# Patient Record
Sex: Female | Born: 1940 | Race: Black or African American | Hispanic: No | Smoking: Never smoker
Health system: Southern US, Community
[De-identification: ages and names within clinical notes are randomized; demographics above are authoritative.]

## PROBLEM LIST (undated history)

## (undated) DIAGNOSIS — R42 Dizziness and giddiness: Secondary | ICD-10-CM

## (undated) DIAGNOSIS — I4892 Unspecified atrial flutter: Secondary | ICD-10-CM

## (undated) DIAGNOSIS — F439 Reaction to severe stress, unspecified: Secondary | ICD-10-CM

## (undated) DIAGNOSIS — J42 Unspecified chronic bronchitis: Secondary | ICD-10-CM

## (undated) DIAGNOSIS — M1990 Unspecified osteoarthritis, unspecified site: Secondary | ICD-10-CM

## (undated) DIAGNOSIS — F4320 Adjustment disorder, unspecified: Secondary | ICD-10-CM

## (undated) DIAGNOSIS — I10 Essential (primary) hypertension: Secondary | ICD-10-CM

## (undated) DIAGNOSIS — I499 Cardiac arrhythmia, unspecified: Secondary | ICD-10-CM

## (undated) DIAGNOSIS — M79671 Pain in right foot: Secondary | ICD-10-CM

## (undated) DIAGNOSIS — R002 Palpitations: Secondary | ICD-10-CM

## (undated) DIAGNOSIS — Z9289 Personal history of other medical treatment: Secondary | ICD-10-CM

## (undated) DIAGNOSIS — E559 Vitamin D deficiency, unspecified: Secondary | ICD-10-CM

## (undated) DIAGNOSIS — E785 Hyperlipidemia, unspecified: Secondary | ICD-10-CM

## (undated) DIAGNOSIS — I495 Sick sinus syndrome: Secondary | ICD-10-CM

## (undated) DIAGNOSIS — Z950 Presence of cardiac pacemaker: Secondary | ICD-10-CM

## (undated) HISTORY — DX: Pain in right foot: M79.671

## (undated) HISTORY — DX: Sick sinus syndrome: I49.5

## (undated) HISTORY — DX: Essential (primary) hypertension: I10

## (undated) HISTORY — DX: Cardiac arrhythmia, unspecified: I49.9

## (undated) HISTORY — PX: ABDOMINAL HYSTERECTOMY: SHX81

## (undated) HISTORY — PX: TONSILLECTOMY AND ADENOIDECTOMY: SUR1326

## (undated) HISTORY — PX: CHOLECYSTECTOMY OPEN: SUR202

## (undated) HISTORY — DX: Adjustment disorder, unspecified: F43.20

## (undated) HISTORY — DX: Reaction to severe stress, unspecified: F43.9

## (undated) HISTORY — DX: Palpitations: R00.2

## (undated) HISTORY — DX: Dizziness and giddiness: R42

## (undated) HISTORY — DX: Hyperlipidemia, unspecified: E78.5

## (undated) HISTORY — DX: Unspecified atrial flutter: I48.92

## (undated) HISTORY — DX: Vitamin D deficiency, unspecified: E55.9

---

## 2013-09-10 DIAGNOSIS — I10 Essential (primary) hypertension: Secondary | ICD-10-CM | POA: Insufficient documentation

## 2013-09-10 DIAGNOSIS — E7800 Pure hypercholesterolemia, unspecified: Secondary | ICD-10-CM | POA: Insufficient documentation

## 2015-10-25 DIAGNOSIS — R739 Hyperglycemia, unspecified: Secondary | ICD-10-CM | POA: Insufficient documentation

## 2016-10-30 DIAGNOSIS — I480 Paroxysmal atrial fibrillation: Secondary | ICD-10-CM | POA: Insufficient documentation

## 2016-11-30 ENCOUNTER — Encounter: Payer: Self-pay | Admitting: Internal Medicine

## 2016-12-03 DIAGNOSIS — E785 Hyperlipidemia, unspecified: Secondary | ICD-10-CM | POA: Insufficient documentation

## 2016-12-03 DIAGNOSIS — I499 Cardiac arrhythmia, unspecified: Secondary | ICD-10-CM | POA: Insufficient documentation

## 2016-12-03 DIAGNOSIS — I4892 Unspecified atrial flutter: Secondary | ICD-10-CM | POA: Insufficient documentation

## 2016-12-03 DIAGNOSIS — I495 Sick sinus syndrome: Secondary | ICD-10-CM | POA: Insufficient documentation

## 2016-12-04 ENCOUNTER — Encounter (INDEPENDENT_AMBULATORY_CARE_PROVIDER_SITE_OTHER): Payer: Self-pay

## 2016-12-04 ENCOUNTER — Encounter: Payer: Self-pay | Admitting: *Deleted

## 2016-12-04 ENCOUNTER — Ambulatory Visit (INDEPENDENT_AMBULATORY_CARE_PROVIDER_SITE_OTHER): Payer: Medicare Other | Admitting: Internal Medicine

## 2016-12-04 ENCOUNTER — Encounter: Payer: Self-pay | Admitting: Internal Medicine

## 2016-12-04 VITALS — BP 140/72 | HR 87 | Ht 62.0 in | Wt 144.4 lb

## 2016-12-04 DIAGNOSIS — I480 Paroxysmal atrial fibrillation: Secondary | ICD-10-CM

## 2016-12-04 NOTE — Patient Instructions (Addendum)
Medication Instructions:  Your physician recommends that you continue on your current medications as directed. Please refer to the Current Medication list given to you today.   Labwork: Lab work to be done today--BMP, CBC   Testing/Procedures: Your physician has recommended that you have a pacemaker inserted. A pacemaker is a small device that is placed under the skin of your chest or abdomen to help control abnormal heart rhythms. This device uses electrical pulses to prompt the heart to beat at a normal rate. Pacemakers are used to treat heart rhythms that are too slow. Wire (leads) are attached to the pacemaker that goes into the chambers of you heart. This is done in the hospital and usually requires and overnight stay. Please see the instruction sheet given to you today for more information.  Please cal [place] to schedule this. Available dates are May 9,10,11.  Follow-Up: Your physician recommends that you schedule a follow-up appointment--2 weeks after pacemaker insertion in the device clinic and 91 days after pacemaker insertion with Dr. Ladona Ridgel.  This can be arranged when pacemaker insertion date determined.     Any Other Special Instructions Will Be Listed Below (If Applicable).     If you need a refill on your cardiac medications before your next appointment, please call your pharmacy.   Pacemaker Implantation, Adult Pacemaker implantation is a procedure to place a pacemaker inside your chest. A pacemaker is a small computer that sends electrical signals to the heart and helps your heart beat normally. A pacemaker also stores information about your heart rhythms. You may need pacemaker implantation if you:  Have a slow heartbeat (bradycardia).  Faint (syncope).  Have shortness of breath (dyspnea) due to heart problems. The pacemaker attaches to your heart through a wire, called a lead. Sometimes just one lead is needed. Other times, there will be two leads. There are two types  of pacemakers:  Transvenous pacemaker. This type is placed under the skin or muscle of your chest. The lead goes through a vein in the chest area to reach the inside of the heart.  Epicardial pacemaker. This type is placed under the skin or muscle of your chest or belly. The lead goes through your chest to the outside of the heart. Tell a health care provider about:  Any allergies you have.  All medicines you are taking, including vitamins, herbs, eye drops, creams, and over-the-counter medicines.  Any problems you or family members have had with anesthetic medicines.  Any blood or bone disorders you have.  Any surgeries you have had.  Any medical conditions you have.  Whether you are pregnant or may be pregnant. What are the risks? Generally, this is a safe procedure. However, problems may occur, including:  Infection.  Bleeding.  Failure of the pacemaker or the lead.  Collapse of a lung or bleeding into a lung.  Blood clot inside a blood vessel with a lead.  Damage to the heart.  Infection inside the heart (endocarditis).  Allergic reactions to medicines. What happens before the procedure? Staying hydrated  Follow instructions from your health care provider about hydration, which may include:  Up to 2 hours before the procedure - you may continue to drink clear liquids, such as water, clear fruit juice, black coffee, and plain tea. Eating and drinking restrictions  Follow instructions from your health care provider about eating and drinking, which may include:  8 hours before the procedure - stop eating heavy meals or foods such as meat, fried foods,  or fatty foods.  6 hours before the procedure - stop eating light meals or foods, such as toast or cereal.  6 hours before the procedure - stop drinking milk or drinks that contain milk.  2 hours before the procedure - stop drinking clear liquids. Medicines   Ask your health care provider about:  Changing or  stopping your regular medicines. This is especially important if you are taking diabetes medicines or blood thinners.  Taking medicines such as aspirin and ibuprofen. These medicines can thin your blood. Do not take these medicines before your procedure if your health care provider instructs you not to.  You may be given antibiotic medicine to help prevent infection. General instructions   You will have a heart evaluation. This may include an electrocardiogram (ECG), chest X-ray, and heart imaging (echocardiogram,  or echo) tests.  You will have blood tests.  Do not use any products that contain nicotine or tobacco, such as cigarettes and e-cigarettes. If you need help quitting, ask your health care provider.  Plan to have someone take you home from the hospital or clinic.  If you will be going home right after the procedure, plan to have someone with you for 24 hours.  Ask your health care provider how your surgical site will be marked or identified. What happens during the procedure?  To reduce your risk of infection:  Your health care team will wash or sanitize their hands.  Your skin will be washed with soap.  Hair may be removed from the surgical area.  An IV tube will be inserted into one of your veins.  You will be given one or more of the following:  A medicine to help you relax (sedative).  A medicine to numb the area (local anesthetic).  A medicine to make you fall asleep (general anesthetic).  If you are getting a transvenous pacemaker:  An incision will be made in your upper chest.  A pocket will be made for the pacemaker. It may be placed under the skin or between layers of muscle.  The lead will be inserted into a blood vessel that returns to the heart.  While X-rays are taken by an imaging machine (fluoroscopy), the lead will be advanced through the vein to the inside of your heart.  The other end of the lead will be tunneled under the skin and attached  to the pacemaker.  If you are getting an epicardial pacemaker:  An incision will be made near your ribs or breastbone (sternum) for the lead.  The lead will be attached to the outside of your heart.  Another incision will be made in your chest or upper belly to create a pocket for the pacemaker.  The free end of the lead will be tunneled under the skin and attached to the pacemaker.  The transvenous or epicardial pacemaker will be tested. Imaging studies may be done to check the lead position.  The incisions will be closed with stitches (sutures), adhesive strips, or skin glue.  Bandages (dressing) will be placed over the incisions. The procedure may vary among health care providers and hospitals. What happens after the procedure?  Your blood pressure, heart rate, breathing rate, and blood oxygen level will be monitored until the medicines you were given have worn off.  You will be given antibiotics and pain medicine.  ECG and chest x-rays will be done.  You will wear a continuous type of ECG (Holter monitor) to check your heart rhythm.  Your  health care provider willprogram the pacemaker.  Do not drive for 24 hours if you received a sedative. This information is not intended to replace advice given to you by your health care provider. Make sure you discuss any questions you have with your health care provider. Document Released: 07/13/2002 Document Revised: 02/10/2016 Document Reviewed: 01/04/2016 Elsevier Interactive Patient Education  2017 [employer].

## 2016-12-04 NOTE — Progress Notes (Signed)
HPI Kristina Gonzales is referred today by Dr. 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 She has been healthy until a month or two ago when she was noted to have spells where she would feel lightheaded. She was found on cardiac monitoring to be in atrial fib with a RVR and HR's over 120/min. She would then have post termination pauses due to sinus node dysfunction of upto 4 seconds. She has never passed out completely. She feels fatigue and some dyspnea when her atrial fib is going fast. She was placed on Xarelto by her MD in [facility]. Her sons live in the area Engineer, agricultural in [place] and Marine scientist in [place]) and would like to remain for any medical procedures. She is fairly anxious. She denies anginal symptoms and has minimal CHF symptoms.   No Known Allergies   Current Outpatient Prescriptions  Medication Sig Dispense Refill  . Cod Liver Oil CAPS Take 1 capsule by mouth daily.    Marland Kitchen diltiazem (DILACOR XR) 120 MG 24 hr capsule Take 120 mg by mouth daily.    Marland Kitchen ezetimibe (ZETIA) 10 MG tablet Take 10 mg by mouth daily.    Marland Kitchen losartan (COZAAR) 100 MG tablet Take 100 mg by mouth daily.    . metoprolol succinate (TOPROL-XL) 50 MG 24 hr tablet Take 25 mg by mouth daily. Take with or immediately following a meal.    . Sodium Fluoride (PREVIDENT 5000 BOOSTER PLUS) 1.1 % PSTE Place onto teeth.    . spironolactone-hydrochlorothiazide (ALDACTAZIDE) 25-25 MG tablet Take 1 tablet by mouth daily.    [name] 20 MG TABS tablet Take 20 mg by mouth daily.  10   No current facility-administered medications for this visit.      Past Medical History:  Diagnosis Date  . Acute pain of right foot   . Anticipatory grieving    06/22/15  . Atrial flutter (HCC)   . Dizziness   . H/O: hysterectomy   . Hx of cholecystectomy   . Hyperlipidemia   . Hypertension   . Irregular heartbeat   . Palpitations   . Stress at home   . Tachy-brady  syndrome (HCC)   . Vitamin D deficiency     ROS:   All systems reviewed and negative except as noted in the HPI.   Past Surgical History:  Procedure Laterality Date  . CHOLECYSTETOMY    . TONSILLECTOMY AND ADENOIDECTOMY       Family History  Problem Relation Age of Onset  . Hypertension Mother     UNDER HOSPICE CARE  . Diverticulosis Mother   . Other Father 80    MYOCARDIAL INFARCTION  . Other Other     PT HAS SIBLINGS X 7     Social History   Social History  . Marital status: Widowed    Spouse name: N/A  . Number of children: 2  . Years of education: N/A   Occupational History  . RETIRED    Social History Main Topics  . Smoking status: Never Smoker  . Smokeless tobacco: Never Used  . Alcohol use No  . Drug use: No  . Sexual activity: Not on file   Other Topics Concern  . Not on file   Social History Narrative  . No narrative on file     BP 140/72 (BP Location: Left Arm)   Pulse 87   Physical Exam:  Well appearing 76  yo woman, NAD HEENT: Unremarkable Neck:  6 cm JVD, no thyromegally Lymphatics:  No adenopathy Back:  No CVA tenderness Lungs:  Clear with no wheezes HEART:  Regular rate rhythm, no murmurs, no rubs, no clicks Abd:  soft, positive bowel sounds, no organomegally, no rebound, no guarding Ext:  2 plus pulses, no edema, no cyanosis, no clubbing Skin:  No rashes no nodules Neuro:  CN II through XII intact, motor grossly intact  EKG - reviewed. NSR with a short PR  Holter monitor - NSR with PAF with a RVR and post termination pauses of up to 4 seconds during the daytime.   Assess/Plan: 1. Atrial fib with a RVR - I have discussed the treatment options with the patient and her son. I would recommend that her diltiazem be stopped and she switched to flecainide in conjunction with her beta blocker once she has some HR support.  2. Coags - she is taking Xarelto and appears to be tolerating this medication nicely 3. Sinus node dysfunction  - she is having daytime pauses of 4 seconds. While she is on both a beta blocker and a calcium channel antagonist, She requires these meds to control her atrial fib. I have discussed the indications, risk,benefits,goals,and expectations of PPM insertion and she would like to consider her options and will call us if she wishes to proceed. 4. HTN heart disease - her blood pressure is up a bit. We would consider switching her to coreg after her PPM is in place.  Lewayne Bunting, M.D.

## 2016-12-05 ENCOUNTER — Telehealth: Payer: Self-pay | Admitting: Internal Medicine

## 2016-12-05 LAB — CBC WITH DIFFERENTIAL/PLATELET
Basophils Absolute: 0 10*3/uL (ref 0.0–0.2)
Basos: 0 %
EOS (ABSOLUTE): 0.2 10*3/uL (ref 0.0–0.4)
EOS: 4 %
HEMATOCRIT: 43.2 % (ref 34.0–46.6)
HEMOGLOBIN: 14.6 g/dL (ref 11.1–15.9)
IMMATURE GRANS (ABS): 0 10*3/uL (ref 0.0–0.1)
IMMATURE GRANULOCYTES: 0 %
LYMPHS: 39 %
Lymphocytes Absolute: 2 10*3/uL (ref 0.7–3.1)
MCH: 29.1 pg (ref 26.6–33.0)
MCHC: 33.8 g/dL (ref 31.5–35.7)
MCV: 86 fL (ref 79–97)
MONOCYTES: 8 %
Monocytes Absolute: 0.4 10*3/uL (ref 0.1–0.9)
NEUTROS PCT: 49 %
Neutrophils Absolute: 2.5 10*3/uL (ref 1.4–7.0)
Platelets: 265 10*3/uL (ref 150–379)
RBC: 5.02 x10E6/uL (ref 3.77–5.28)
RDW: 13.7 % (ref 12.3–15.4)
WBC: 5.2 10*3/uL (ref 3.4–10.8)

## 2016-12-05 LAB — BASIC METABOLIC PANEL
BUN / CREAT RATIO: 24 (ref 12–28)
BUN: 22 mg/dL (ref 8–27)
CALCIUM: 10.4 mg/dL — AB (ref 8.7–10.3)
CO2: 24 mmol/L (ref 18–29)
CREATININE: 0.93 mg/dL (ref 0.57–1.00)
Chloride: 101 mmol/L (ref 96–106)
GFR calc Af Amer: 70 mL/min/{1.73_m2} (ref >59)
GFR calc non Af Amer: 61 mL/min/{1.73_m2} (ref >59)
GLUCOSE: 108 mg/dL — AB (ref 65–99)
Potassium: 4.4 mmol/L (ref 3.5–5.2)
Sodium: 140 mmol/L (ref 134–144)

## 2016-12-05 NOTE — Telephone Encounter (Signed)
I scheduled procedure for May 9,2018 at 12:30. I spoke with pt and told her to arrive at [facility] at 10:30 on May 9th. Appt in device clinic arranged for 11:30 on May 23,2018. 91 day follow up with Dr. Ladona Ridgel arranged for August 21,2018 at 1:45.  I made pt aware of follow up appointments. Pt already has pacemaker insertion instructions and verbalizes understanding of instructions and arrival time.

## 2016-12-05 NOTE — Telephone Encounter (Signed)
New Message  Pt call requesting to speakw ith RN to det up appt for device. Pt state she would like to set appt for 5/9 around 9am if possible. Please call back to discuss

## 2016-12-12 ENCOUNTER — Ambulatory Visit (HOSPITAL_COMMUNITY)
Admission: RE | Admit: 2016-12-12 | Discharge: 2016-12-13 | Disposition: A | Discharge status: Home / Self Care | Payer: Medicare Other | Source: Ambulatory Visit | Attending: Internal Medicine | Admitting: Internal Medicine

## 2016-12-12 ENCOUNTER — Encounter (HOSPITAL_COMMUNITY)
Admission: RE | Disposition: A | Discharge status: Home / Self Care | Payer: Self-pay | Source: Ambulatory Visit | Attending: Internal Medicine

## 2016-12-12 ENCOUNTER — Encounter (HOSPITAL_COMMUNITY): Payer: Self-pay | Admitting: *Deleted

## 2016-12-12 DIAGNOSIS — I4892 Unspecified atrial flutter: Secondary | ICD-10-CM | POA: Insufficient documentation

## 2016-12-12 DIAGNOSIS — I700 Atherosclerosis of aorta: Secondary | ICD-10-CM | POA: Insufficient documentation

## 2016-12-12 DIAGNOSIS — Z7901 Long term (current) use of anticoagulants: Secondary | ICD-10-CM | POA: Diagnosis not present

## 2016-12-12 DIAGNOSIS — I10 Essential (primary) hypertension: Secondary | ICD-10-CM | POA: Diagnosis not present

## 2016-12-12 DIAGNOSIS — E559 Vitamin D deficiency, unspecified: Secondary | ICD-10-CM | POA: Diagnosis not present

## 2016-12-12 DIAGNOSIS — I480 Paroxysmal atrial fibrillation: Secondary | ICD-10-CM | POA: Diagnosis not present

## 2016-12-12 DIAGNOSIS — E785 Hyperlipidemia, unspecified: Secondary | ICD-10-CM | POA: Insufficient documentation

## 2016-12-12 DIAGNOSIS — I495 Sick sinus syndrome: Secondary | ICD-10-CM | POA: Diagnosis present

## 2016-12-12 DIAGNOSIS — Z950 Presence of cardiac pacemaker: Secondary | ICD-10-CM

## 2016-12-12 HISTORY — DX: Unspecified chronic bronchitis: J42

## 2016-12-12 HISTORY — DX: Personal history of other medical treatment: Z92.89

## 2016-12-12 HISTORY — PX: PACEMAKER IMPLANT: EP1218

## 2016-12-12 HISTORY — DX: Presence of cardiac pacemaker: Z95.0

## 2016-12-12 HISTORY — PX: INSERT / REPLACE / REMOVE PACEMAKER: SUR710

## 2016-12-12 HISTORY — DX: Unspecified osteoarthritis, unspecified site: M19.90

## 2016-12-12 LAB — SURGICAL PCR SCREEN
MRSA, PCR: NEGATIVE
STAPHYLOCOCCUS AUREUS: POSITIVE — AB

## 2016-12-12 SURGERY — PACEMAKER IMPLANT

## 2016-12-12 MED ORDER — CHLORHEXIDINE GLUCONATE 4 % EX LIQD: 60.0000 mL | Freq: Once | CUTANEOUS | Status: DC

## 2016-12-12 MED ORDER — ADULT MULTIVITAMIN W/MINERALS CH
1.0000 | ORAL_TABLET | Freq: Every day | ORAL | Status: DC
  Administered 2016-12-12 – 2016-12-13 (×2): 1 via ORAL
  Filled 2016-12-12 (×2): qty 1

## 2016-12-12 MED ORDER — EZETIMIBE 10 MG PO TABS
10.0000 mg | ORAL_TABLET | Freq: Every day | ORAL | Status: DC
  Administered 2016-12-12 – 2016-12-13 (×2): 10 mg via ORAL
  Filled 2016-12-12 (×2): qty 1

## 2016-12-12 MED ORDER — YOU HAVE A PACEMAKER BOOK
Freq: Once | Status: AC
  Administered 2016-12-12: 21:00:00
  Filled 2016-12-12: qty 1

## 2016-12-12 MED ORDER — COD LIVER OIL/VITAMINS A & D PO CAPS: 1.0000 | ORAL_CAPSULE | Freq: Every day | ORAL | Status: DC

## 2016-12-12 MED ORDER — MUPIROCIN 2 % EX OINT
1.0000 "application " | TOPICAL_OINTMENT | Freq: Two times a day (BID) | CUTANEOUS | Status: DC
  Administered 2016-12-12 – 2016-12-13 (×2): 1 via NASAL

## 2016-12-12 MED ORDER — MUPIROCIN 2 % EX OINT
1.0000 "application " | TOPICAL_OINTMENT | Freq: Once | CUTANEOUS | Status: AC
  Administered 2016-12-12: 1 via TOPICAL

## 2016-12-12 MED ORDER — ONDANSETRON HCL 4 MG/2ML IJ SOLN: 4.0000 mg | Freq: Four times a day (QID) | INTRAMUSCULAR | Status: DC | PRN

## 2016-12-12 MED ORDER — MUPIROCIN 2 % EX OINT
TOPICAL_OINTMENT | CUTANEOUS | Status: AC
  Administered 2016-12-12: 1 via TOPICAL
  Filled 2016-12-12: qty 22

## 2016-12-12 MED ORDER — LOSARTAN POTASSIUM 50 MG PO TABS
100.0000 mg | ORAL_TABLET | Freq: Every day | ORAL | Status: DC
  Administered 2016-12-12: 100 mg via ORAL
  Filled 2016-12-12: qty 2

## 2016-12-12 MED ORDER — FENTANYL CITRATE (PF) 100 MCG/2ML IJ SOLN
INTRAMUSCULAR | Status: AC
Start: 2016-12-12 — End: 2016-12-12
  Filled 2016-12-12: qty 2

## 2016-12-12 MED ORDER — SODIUM CHLORIDE 0.9 % IV SOLN
INTRAVENOUS | Status: DC
  Administered 2016-12-12: 12:00:00 via INTRAVENOUS

## 2016-12-12 MED ORDER — SPIRONOLACTONE-HCTZ 25-25 MG PO TABS
0.5000 | ORAL_TABLET | Freq: Every day | ORAL | Status: DC
  Filled 2016-12-12: qty 1

## 2016-12-12 MED ORDER — MIDAZOLAM HCL 5 MG/5ML IJ SOLN
INTRAMUSCULAR | Status: DC | PRN
  Administered 2016-12-12 (×4): 1 mg via INTRAVENOUS

## 2016-12-12 MED ORDER — CEFAZOLIN SODIUM-DEXTROSE 2-4 GM/100ML-% IV SOLN
INTRAVENOUS | Status: AC
  Filled 2016-12-12: qty 100

## 2016-12-12 MED ORDER — LIDOCAINE HCL (PF) 1 % IJ SOLN
INTRAMUSCULAR | Status: AC
  Filled 2016-12-12: qty 60

## 2016-12-12 MED ORDER — HYDROCHLOROTHIAZIDE 25 MG PO TABS
25.0000 mg | ORAL_TABLET | Freq: Every day | ORAL | Status: DC
  Administered 2016-12-12: 25 mg via ORAL
  Filled 2016-12-12: qty 1

## 2016-12-12 MED ORDER — METOPROLOL SUCCINATE ER 50 MG PO TB24
50.0000 mg | ORAL_TABLET | Freq: Every day | ORAL | Status: DC
  Administered 2016-12-12: 50 mg via ORAL
  Filled 2016-12-12: qty 1

## 2016-12-12 MED ORDER — CEFAZOLIN SODIUM-DEXTROSE 1-4 GM/50ML-% IV SOLN
1.0000 g | Freq: Four times a day (QID) | INTRAVENOUS | Status: AC
  Administered 2016-12-12 – 2016-12-13 (×3): 1 g via INTRAVENOUS
  Filled 2016-12-12 (×3): qty 50

## 2016-12-12 MED ORDER — SODIUM CHLORIDE 0.9 % IR SOLN
80.0000 mg | Status: AC
  Administered 2016-12-12: 80 mg

## 2016-12-12 MED ORDER — SPIRONOLACTONE 25 MG PO TABS
25.0000 mg | ORAL_TABLET | Freq: Every day | ORAL | Status: DC
  Administered 2016-12-12: 25 mg via ORAL
  Filled 2016-12-12: qty 1

## 2016-12-12 MED ORDER — OFF THE BEAT BOOK
Freq: Once | Status: AC
  Administered 2016-12-12: 21:00:00
  Filled 2016-12-12: qty 1

## 2016-12-12 MED ORDER — ACETAMINOPHEN 325 MG PO TABS
325.0000 mg | ORAL_TABLET | ORAL | Status: DC | PRN
  Administered 2016-12-12 – 2016-12-13 (×2): 650 mg via ORAL
  Filled 2016-12-12 (×2): qty 2

## 2016-12-12 MED ORDER — FENTANYL CITRATE (PF) 100 MCG/2ML IJ SOLN
INTRAMUSCULAR | Status: DC | PRN
  Administered 2016-12-12: 25 ug via INTRAVENOUS
  Administered 2016-12-12 (×2): 12.5 ug via INTRAVENOUS

## 2016-12-12 MED ORDER — CHLORHEXIDINE GLUCONATE CLOTH 2 % EX PADS
6.0000 | MEDICATED_PAD | Freq: Every day | CUTANEOUS | Status: DC
  Administered 2016-12-12 – 2016-12-13 (×2): 6 via TOPICAL

## 2016-12-12 MED ORDER — SODIUM FLUORIDE 1.1 % DT PSTE: 1.0000 "application " | PASTE | Freq: Every day | DENTAL | Status: DC

## 2016-12-12 MED ORDER — SODIUM CHLORIDE 0.9 % IR SOLN
Status: AC
  Filled 2016-12-12: qty 2

## 2016-12-12 MED ORDER — SODIUM CHLORIDE 0.9 % IV SOLN: INTRAVENOUS | Status: DC

## 2016-12-12 MED ORDER — HEPARIN (PORCINE) IN NACL 2-0.9 UNIT/ML-% IJ SOLN
INTRAMUSCULAR | Status: DC | PRN
  Administered 2016-12-12: 14:00:00

## 2016-12-12 MED ORDER — MIDAZOLAM HCL 5 MG/5ML IJ SOLN
INTRAMUSCULAR | Status: AC
  Filled 2016-12-12: qty 5

## 2016-12-12 MED ORDER — LIDOCAINE HCL (PF) 1 % IJ SOLN
INTRAMUSCULAR | Status: DC | PRN
  Administered 2016-12-12: 40 mL via INTRADERMAL

## 2016-12-12 MED ORDER — HEPARIN (PORCINE) IN NACL 2-0.9 UNIT/ML-% IJ SOLN
INTRAMUSCULAR | Status: AC
  Filled 2016-12-12: qty 500

## 2016-12-12 MED ORDER — CEFAZOLIN SODIUM-DEXTROSE 2-4 GM/100ML-% IV SOLN
2.0000 g | INTRAVENOUS | Status: AC
  Administered 2016-12-12: 2 g via INTRAVENOUS

## 2016-12-12 SURGICAL SUPPLY — 11 items
CABLE SURGICAL S-101-97-12 (CABLE) ×3 IMPLANT
CATH RIGHTSITE C315HIS02 (CATHETERS) ×3 IMPLANT
IPG PACE AZUR XT DR MRI W1DR01 (Pacemaker) ×1 IMPLANT
LEAD CAPSURE NOVUS 45CM (Lead) ×3 IMPLANT
LEAD SELECT SECURE 3830 383069 (Lead) ×1 IMPLANT
PACE AZURE XT DR MRI W1DR01 (Pacemaker) ×3 IMPLANT
PAD DEFIB LIFELINK (PAD) ×3 IMPLANT
SELECT SECURE 3830 383069 (Lead) ×3 IMPLANT
SHEATH CLASSIC 7F (SHEATH) ×6 IMPLANT
TRAY PACEMAKER INSERTION (PACKS) ×3 IMPLANT
WIRE HI TORQ VERSACORE-J 145CM (WIRE) ×3 IMPLANT

## 2016-12-12 NOTE — Discharge Summary (Signed)
ELECTROPHYSIOLOGY PROCEDURE DISCHARGE SUMMARY    Patient ID: Kristina Gonzales,  MRN: [id number], DOB/AGE: August 14, 1940 76 y.o.  Admit date: 12/12/2016 Discharge date: 12/13/16  Primary Care Physician: Cathie Beams, MD  Primary Cardiologist: Dr. Katrinka Blazing Electrophysiologist: Dr. Ladona Ridgel  Primary Discharge Diagnosis:  1. Tachy-brady syndrome  Secondary Discharge Diagnosis:  1. PAF     CHA2DS2Vasc is at least 3, on xarelto 2. HTN 3. HLD  No Known Allergies   Procedures This Admission:  1.  Implantation of a MDT dual chamber PPM on 12/12/16 by Dr Ladona Ridgel.  The patient received a Medtronic (serial number [id number]) pacemaker, with Medtronic [id number] (serial number [id number]) right atrial lead and a Medtronic [id number] (serial number [id number]) right ventricular lead (HIS position) There were no immediate post procedure complications. 2.  CXR on 12/13/16 demonstrated no pneumothorax status post device implantation.   Brief HPI: Kristina Gonzales is a 76 y.o. female was referred to electrophysiology in the outpatient setting for consideration of PPM implantation.  Past medical history includes as noted above.  The patient has had found on monitoring with AF w/RVR and post termination pauses of up to 4 seconds, with symptoms of dizziness, no syncope.  Risks, benefits, and alternatives to PPM implantation were reviewed with the patient who wished to proceed.   Hospital Course:  The patient was admitted and underwent implantation of a PPM with details as outlined above.  She was monitored on telemetry overnight which demonstrated SR.  Left chest was without hematoma or ecchymosis.  The device was interrogated and found to be functioning normally.  CXR was obtained and demonstrated no pneumothorax status post device implantation.  Wound care, arm mobility, and restrictions were reviewed with the patient.  The patient was examined by Dr. Ladona Ridgel and considered stable for discharge to home.  We will stop her  diltiazem and increase her Toprol to 50mg  daily, she is in SR, will resume xarelto tomorrow.   Physical Exam: Vitals:   12/12/16 2000 12/12/16 2057 12/13/16 0640 12/13/16 0800  BP:  (!) 141/64 (!) 175/75 (!) 162/72  Pulse: 80 78 75 78  Resp: (!) 21 14 14 15   Temp:  97.4 F (36.3 C) 97.8 F (36.6 C) 97 F (36.1 C)  TempSrc:  Oral Oral Oral  SpO2: 98% 100% 100% 98%  Weight:   143 lb 4.8 oz (65 kg)   Height:        GEN- The patient is well appearing, alert and oriented x 3 today.   HEENT: normocephalic, atraumatic; sclera clear, conjunctiva pink; hearing intact; oropharynx clear; neck supple, no JVP Lungs- CTA b/l, normal work of breathing.  No wheezes, rales, rhonchi Heart- RRR, no murmurs, rubs or gallops, PMI not laterally displaced GI- soft, non-tender, non-distended Extremities- no clubbing, cyanosis, or edema MS- no significant deformity or atrophy Skin- warm and dry, no rash or lesion, left chest without hematoma/ecchymosis Psych- euthymic mood, full affect Neuro- no gross deficits   Labs:   Lab Results  Component Value Date   WBC 5.2 12/04/2016   HCT 43.2 12/04/2016   MCV 86 12/04/2016   PLT 265 12/04/2016   No results for input(s): NA, K, CL, CO2, BUN, CREATININE, CALCIUM, PROT, BILITOT, ALKPHOS, ALT, AST, GLUCOSE in the last 168 hours.  Invalid input(s): LABALBU  Discharge Medications:  Allergies as of 12/13/2016   No Known Allergies     Medication List    STOP taking these medications   diltiazem 120 MG 24 hr  capsule Commonly known as:  DILACOR XR     TAKE these medications   COD LIVER OIL/VITAMINS A & D Caps Take 1 capsule by mouth daily.   CORICIDIN HBP CONGESTION/COUGH PO Take 1 tablet by mouth 2 (two) times daily as needed (alleriges).   ezetimibe 10 MG tablet Commonly known as:  ZETIA Take 10 mg by mouth daily.   losartan 100 MG tablet Commonly known as:  COZAAR Take 100 mg by mouth daily with supper.   metoprolol succinate 50 MG 24 hr  tablet Commonly known as:  TOPROL-XL Take 1 tablet (50 mg total) by mouth daily with supper. Take with or immediately following a meal. What changed:  how much to take   multivitamin with minerals Tabs tablet Take 1 tablet by mouth daily.   PREVIDENT 5000 BOOSTER PLUS 1.1 % Pste Generic drug:  Sodium Fluoride Place 1 application onto teeth at bedtime.   spironolactone-hydrochlorothiazide 25-25 MG tablet Commonly known as:  ALDACTAZIDE Take 0.5 tablets by mouth daily with supper.   SYSTANE OP Apply 1 drop to eye 2 (two) times daily.   XARELTO 20 MG Tabs tablet Generic drug:  rivaroxaban Take 20 mg by mouth daily with supper.       Disposition:  home Discharge Instructions    Diet - low sodium heart healthy    Complete by:  As directed    Increase activity slowly    Complete by:  As directed      Follow-up Information    [facility] [address] [facility] Follow up on 12/26/2016.   Specialty:  Cardiology Why:  11:30AM, wound check Contact information: [address], [address] [place] [id number] [phone]       Marinus Mawaylor, Arpan Eskelson W, MD Follow up on 03/26/2017.   Specialty:  Cardiology Why:  1:45PM Contact information: [address] [place] [id number] [phone]           Duration of Discharge Encounter: Greater than 30 minutes including physician time.  Norma FredricksonSigned, Renee Ursuy, PA-C 12/13/2016 9:26 AM   EP Attending  Patient seen and examined. She is doing well status post insertion of a dual-chamber His bundle pacemaker. We have asked her to increase her dose of Toprol and discontinued diltiazem. I would expect that we will start her on flecainide as an outpatient but wanted to see first how much atrial fibrillation she is having now that her pacemaker is placed. Her pocket looks good today and she has no complications from her procedure and her pacemaker interrogation under my direction demonstrates normal dual-chamber  pacing. She will undergo the usual follow-up.  Lewayne BuntingGregg Season Astacio, M.D.

## 2016-12-12 NOTE — Interval H&P Note (Signed)
History and Physical Interval Note:  12/12/2016 1:08 PM  Ellis SavageJanet S Westermeyer  has presented today for surgery, with the diagnosis of paf - tachibradie  The various methods of treatment have been discussed with the patient and family. After consideration of risks, benefits and other options for treatment, the patient has consented to  Procedure(s): Pacemaker Implant (N/A) as a surgical intervention .  The patient's history has been reviewed, patient examined, no change in status, stable for surgery.  I have reviewed the patient's chart and labs.  Questions were answered to the patient's satisfaction.     Lewayne BuntingGregg Taylor

## 2016-12-12 NOTE — Care Management Note (Signed)
Case Management Note  Patient Details  Name: Kristina Gonzales MRN: [id number] Date of Birth: 01/18/1941  Subjective/Objective:  s/p pacemaker implant.                   Action/Plan: NCM will follow for dc needs.  Expected Discharge Date:                  Expected Discharge Plan:     In-House Referral:     Discharge planning Services  CM Consult  Post Acute Care Choice:    Choice offered to:     DME Arranged:    DME Agency:     HH Arranged:    HH Agency:     Status of Service:  In process, will continue to follow  If discussed at Long Length of Stay Meetings, dates discussed:    Additional Comments:  Leone Havenaylor, Brynn Mulgrew Clinton, RN 12/12/2016, 5:25 PM

## 2016-12-12 NOTE — H&P (View-Only) (Signed)
HPI [name] is referred today by Dr. 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 She has been healthy until a month or two ago when she was noted to have spells where she would feel lightheaded. She was found on cardiac monitoring to be in atrial fib with a RVR and HR's over 120/min. She would then have post termination pauses due to sinus node dysfunction of upto 4 seconds. She has never passed out completely. She feels fatigue and some dyspnea when her atrial fib is going fast. She was placed on [place] [facility] [place]. Her sons live in the area Engineer, agricultural[place] and Marine scientistadiologist in [place] and would like to remain for any medical procedures. She is fairly anxious. She denies anginal symptoms and has minimal CHF symptoms.   No Known Allergies   Current Outpatient Prescriptions  Medication Sig Dispense Refill  . Cod Liver Oil CAPS Take 1 capsule by mouth daily.    Marland Kitchen. diltiazem (DILACOR XR) 120 MG 24 hr capsule Take 120 mg by mouth daily.    Marland Kitchen. ezetimibe (ZETIA) 10 MG tablet Take 10 mg by mouth daily.    Marland Kitchen. losartan (COZAAR) 100 MG tablet Take 100 mg by mouth daily.    . metoprolol succinate (TOPROL-XL) 50 MG 24 hr tablet Take 25 mg by mouth daily. Take with or immediately following a meal.    . Sodium Fluoride (PREVIDENT 5000 BOOSTER PLUS) 1.1 % PSTE Place onto teeth.    . spironolactone-hydrochlorothiazide (ALDACTAZIDE) 25-25 MG tablet Take 1 tablet by mouth daily.    [name]. XARELTO 20 MG TABS tablet Take 20 mg by mouth daily.  10   No current facility-administered medications for this visit.      Past Medical History:  Diagnosis Date  . Acute pain of right foot   . Anticipatory grieving    06/22/15  . Atrial flutter (HCC)   . Dizziness   . H/O: hysterectomy   . Hx of cholecystectomy   . Hyperlipidemia   . Hypertension   . Irregular heartbeat   . Palpitations   . Stress at home   . Tachy-brady  syndrome (HCC)   . Vitamin D deficiency     ROS:   All systems reviewed and negative except as noted in the HPI.   Past Surgical History:  Procedure Laterality Date  . CHOLECYSTETOMY    . TONSILLECTOMY AND ADENOIDECTOMY       Family History  Problem Relation Age of Onset  . Hypertension Mother     UNDER HOSPICE CARE  . Diverticulosis Mother   . Other Father [id number]    MYOCARDIAL INFARCTION  . Other Other     PT HAS SIBLINGS X 7     Social History   Social History  . Marital status: Widowed    Spouse name: N/A  . Number of children: 2  . Years of education: N/A   Occupational History  . RETIRED    Social History Main Topics  . Smoking status: Never Smoker  . Smokeless tobacco: Never Used  . Alcohol use No  . Drug use: No  . Sexual activity: Not on file   Other Topics Concern  . Not on file   Social History Narrative  . No narrative on file     BP 140/72 (BP Location: Left Arm)   Pulse 87   Physical Exam:  Well appearing 76  yo woman, NAD HEENT: Unremarkable Neck:  6 cm JVD, no thyromegally Lymphatics:  No adenopathy Back:  No CVA tenderness Lungs:  Clear with no wheezes HEART:  Regular rate rhythm, no murmurs, no rubs, no clicks Abd:  soft, positive bowel sounds, no organomegally, no rebound, no guarding Ext:  2 plus pulses, no edema, no cyanosis, no clubbing Skin:  No rashes no nodules Neuro:  CN II through XII intact, motor grossly intact  EKG - reviewed. NSR with a short PR  Holter monitor - NSR with PAF with a RVR and post termination pauses of up to 4 seconds during the daytime.   Assess/Plan: 1. Atrial fib with a RVR - I have discussed the treatment options with the patient and her son. I would recommend that her diltiazem be stopped and she switched to flecainide in conjunction with her beta blocker once she has some HR support.  2. Coags - she is taking Xarelto and appears to be tolerating this medication nicely 3. Sinus node dysfunction  - she is having daytime pauses of 4 seconds. While she is on both a beta blocker and a calcium channel antagonist, She requires these meds to control her atrial fib. I have discussed the indications, risk,benefits,goals,and expectations of PPM insertion and she would like to consider her options and will call us if she wishes to proceed. 4. HTN heart disease - her blood pressure is up a bit. We would consider switching her to coreg after her PPM is in place.  Lewayne Bunting, M.D.

## 2016-12-13 ENCOUNTER — Ambulatory Visit (HOSPITAL_COMMUNITY): Payer: Medicare Other

## 2016-12-13 ENCOUNTER — Encounter (HOSPITAL_COMMUNITY): Payer: Self-pay | Admitting: Internal Medicine

## 2016-12-13 DIAGNOSIS — E785 Hyperlipidemia, unspecified: Secondary | ICD-10-CM | POA: Diagnosis not present

## 2016-12-13 DIAGNOSIS — I495 Sick sinus syndrome: Secondary | ICD-10-CM | POA: Diagnosis not present

## 2016-12-13 DIAGNOSIS — I480 Paroxysmal atrial fibrillation: Secondary | ICD-10-CM | POA: Diagnosis not present

## 2016-12-13 DIAGNOSIS — I10 Essential (primary) hypertension: Secondary | ICD-10-CM | POA: Diagnosis not present

## 2016-12-13 MED ORDER — METOPROLOL SUCCINATE ER 50 MG PO TB24: 50.0000 mg | ORAL_TABLET | Freq: Every day | ORAL | 3 refills | Status: DC

## 2016-12-13 NOTE — Progress Notes (Signed)
Patient reported mild dizziness while in bed this am.  Yevette Edwardsenee Urusy, PA aware.  Orthostatic readings negative; ambulated in hall without difficulty or increase in symptoms.  DC'd home as discussed with Renee.

## 2016-12-13 NOTE — Discharge Instructions (Signed)
° ° °  Supplemental Discharge Instructions for  Pacemaker/Defibrillator Patients  Activity No heavy lifting or vigorous activity with your left/right arm for 6 to 8 weeks.  Do not raise your left/right arm above your head for one week.  Gradually raise your affected arm as drawn below.             12/16/16                     12/17/16                    12/18/16                   12/19/16 __  NO DRIVING for  1 week   ; you may begin driving on   1/61/095/16/18 .  WOUND CARE - Keep the wound area clean and dry.  Do not get this area wet for one week. No showers for one week; you may shower on  12/19/16   . - The tape/steri-strips on your wound will fall off; do not pull them off.  No bandage is needed on the site.  DO  NOT apply any creams, oils, or ointments to the wound area. - If you notice any drainage or discharge from the wound, any swelling or bruising at the site, or you develop a fever > 101? F after you are discharged home, call the office at once.  Special Instructions - You are still able to use cellular telephones; use the ear opposite the side where you have your pacemaker/defibrillator.  Avoid carrying your cellular phone near your device. - When traveling through airports, show security personnel your identification card to avoid being screened in the metal detectors.  Ask the security personnel to use the hand wand. - Avoid arc welding equipment, MRI testing (magnetic resonance imaging), TENS units (transcutaneous nerve stimulators).  Call the office for questions about other devices. - Avoid electrical appliances that are in poor condition or are not properly grounded. - Microwave ovens are safe to be near or to operate.

## 2016-12-26 ENCOUNTER — Ambulatory Visit (INDEPENDENT_AMBULATORY_CARE_PROVIDER_SITE_OTHER): Payer: Medicare Other | Admitting: Internal Medicine

## 2016-12-26 ENCOUNTER — Encounter: Payer: Self-pay | Admitting: Internal Medicine

## 2016-12-26 VITALS — BP 132/82 | HR 106 | Resp 16

## 2016-12-26 DIAGNOSIS — I480 Paroxysmal atrial fibrillation: Secondary | ICD-10-CM

## 2016-12-26 DIAGNOSIS — I495 Sick sinus syndrome: Secondary | ICD-10-CM

## 2016-12-26 DIAGNOSIS — Z950 Presence of cardiac pacemaker: Secondary | ICD-10-CM

## 2016-12-26 LAB — CUP PACEART INCLINIC DEVICE CHECK
Battery Remaining Longevity: 179 mo
Battery Voltage: 3.2 V
Brady Statistic AP VP Percent: 0.02 %
Brady Statistic AS VS Percent: 93.61 %
Implantable Lead Implant Date: 20180509
Implantable Lead Implant Date: 20180509
Implantable Lead Location: 753860
Implantable Lead Model: 3830
Implantable Lead Model: 5076
Implantable Pulse Generator Implant Date: 20180509
Lead Channel Impedance Value: 456 Ohm
Lead Channel Impedance Value: 684 Ohm
Lead Channel Pacing Threshold Amplitude: 2 V
Lead Channel Pacing Threshold Pulse Width: 1 ms
Lead Channel Setting Pacing Amplitude: 3 V
Lead Channel Setting Pacing Amplitude: 3.5 V
Lead Channel Setting Sensing Sensitivity: 2 mV
MDC IDC LEAD LOCATION: 753859
MDC IDC MSMT LEADCHNL RA IMPEDANCE VALUE: 304 Ohm
MDC IDC MSMT LEADCHNL RA SENSING INTR AMPL: 1.25 mV
MDC IDC MSMT LEADCHNL RV IMPEDANCE VALUE: 361 Ohm
MDC IDC MSMT LEADCHNL RV SENSING INTR AMPL: 4.25 mV
MDC IDC SESS DTM: 20180523160359
MDC IDC SET LEADCHNL RV PACING PULSEWIDTH: 1 ms
MDC IDC STAT BRADY AP VS PERCENT: 6.29 %
MDC IDC STAT BRADY AS VP PERCENT: 0.11 %
MDC IDC STAT BRADY RA PERCENT PACED: 3.92 %
MDC IDC STAT BRADY RV PERCENT PACED: 2.91 %

## 2016-12-26 NOTE — Patient Instructions (Signed)
Medication Instructions:   -STOP your Aldactazide (spironolactone-hydrochlorothiazide) to see if it improves your dizziness.  Labwork: N/A  Testing/Procedures: N/A  Follow-Up:  Follow-up with Dr. Ladona Ridgelaylor on 01/11/17 at 10:30am.  Any Other Special Instructions Will Be Listed Below (If Applicable).  Please check your blood pressure each morning and keep a log of your readings.  Bring this log with you to your appointment with Dr. Ladona Ridgelaylor on 01/11/17 at 10:30am.   If you need a refill on your cardiac medications before your next appointment, please call your pharmacy.

## 2016-12-26 NOTE — Progress Notes (Signed)
HPI Kristina Gonzales presents today for an incision check and is seen for an unscheduled visit because of symptoms of syncope when she lies down. She is a pleasant 76 yo woman with symptomatic tachy-brady syndrome who had pauses of up to 6 seconds as well as atrial fib with a RVR. The patient underwent PPM insertion with his bundle pacing and was DC home. She returns today noting that she becomes dizzy and thinks that she is passing out when and only when she lies down to go to bed. She has minimal palpitations. No fever or chills. Maybe a little dizzy when she stands up quickly.  No Known Allergies   Current Outpatient Prescriptions  Medication Sig Dispense Refill  . COD LIVER OIL/VITAMINS A & D CAPS Take 1 capsule by mouth daily.    Marland Kitchen ezetimibe (ZETIA) 10 MG tablet Take 10 mg by mouth daily.    Marland Kitchen losartan (COZAAR) 100 MG tablet Take 100 mg by mouth daily with supper.     . metoprolol succinate (TOPROL-XL) 50 MG 24 hr tablet Take 1 tablet (50 mg total) by mouth daily with supper. Take with or immediately following a meal. 30 tablet 3  . Polyethyl Glycol-Propyl Glycol (SYSTANE OP) Apply 1 drop to eye 2 (two) times daily.    . Sodium Fluoride (PREVIDENT 5000 BOOSTER PLUS) 1.1 % PSTE Place 1 application onto teeth at bedtime.     Marland Kitchen spironolactone-hydrochlorothiazide (ALDACTAZIDE) 25-25 MG tablet Take 0.5 tablets by mouth daily with supper.     [name] 20 MG TABS tablet Take 20 mg by mouth daily with supper.   10  . Dextromethorphan-Guaifenesin (CORICIDIN HBP CONGESTION/COUGH PO) Take 1 tablet by mouth 2 (two) times daily as needed (alleriges).    . Multiple Vitamin (MULTIVITAMIN WITH MINERALS) TABS tablet Take 1 tablet by mouth daily.     No current facility-administered medications for this visit.      Past Medical History:  Diagnosis Date  . Acute pain of right foot   . Anticipatory grieving    06/22/15  . Arthritis    "a little in my right knee" (12/12/2016)  . Atrial flutter (HCC)    . Chronic bronchitis (HCC)    "less now since I've retired" (12/12/2016)  . Dizziness   . History of blood transfusion    "related to OR" (12/12/2016)  . Hyperlipidemia   . Hypertension   . Irregular heartbeat   . Palpitations   . Presence of permanent cardiac pacemaker   . Stress at home   . Tachy-brady syndrome (HCC)   . Vitamin D deficiency     ROS:   All systems reviewed and negative except as noted in the HPI.   Past Surgical History:  Procedure Laterality Date  . ABDOMINAL HYSTERECTOMY     "partial"  . CHOLECYSTECTOMY OPEN    . INSERT / REPLACE / REMOVE PACEMAKER  12/12/2016  . PACEMAKER IMPLANT N/A 12/12/2016   Procedure: Pacemaker Implant;  Surgeon: Marinus Maw, MD;  [facility]: [facility] INVASIVE CV LAB;  Service: Cardiovascular;  Laterality: N/A;  . TONSILLECTOMY AND ADENOIDECTOMY       Family History  Problem Relation Age of Onset  . Hypertension Mother        UNDER HOSPICE CARE  . Diverticulosis Mother   . Other Father 68       MYOCARDIAL INFARCTION  . Other Other        PT HAS SIBLINGS X 7  Social History   Social History  . Marital status: Widowed    Spouse name: N/A  . Number of children: 2  . Years of education: N/A   Occupational History  . RETIRED    Social History Main Topics  . Smoking status: Never Smoker  . Smokeless tobacco: Never Used  . Alcohol use No  . Drug use: No  . Sexual activity: No   Other Topics Concern  . Not on file   Social History Narrative  . No narrative on file     BP 132/82 (BP Location: Right Arm, Patient Position: Sitting, Cuff Size: Normal)   Pulse (!) 106   Resp 16   Physical Exam:   CBG 93, BP 132/82, HR 106bpm, R - 16   Well appearing NAD HEENT: Unremarkable Neck:  No JVD, no thyromegally Lymphatics:  No adenopathy Back:  No CVA tenderness Lungs:  Clear HEART:  Regular rate rhythm, no murmurs, no rubs, no clicks Abd:  soft, positive bowel sounds, no organomegally, no rebound, no guarding Ext:   2 plus pulses, no edema, no cyanosis, no clubbing Skin:  No rashes no nodules Neuro:  CN II through XII intact, motor grossly intact  EKG - atrial fib with a RVR  DEVICE  Normal device function.  See PaceArt for details.   Assess/Plan: 1. Dizziness - I am a little unclear about the etiology of her symptoms and have recommended she stop taking her diuretic 2. HTN heart disease - the patient's blood pressure is controlled. Hopefully it will not go up too much with stopping her diuretic 3. PAF - interogation of her device demonstrates that she is in atrial fib about half the time.  4. Anxiety - I suspect that this is a big part of her problem. She is not at home but is anxious about [place] back [place].   Leonia ReevesGregg Taylor,M.D.

## 2017-01-01 ENCOUNTER — Telehealth: Payer: Self-pay | Admitting: *Deleted

## 2017-01-01 NOTE — Telephone Encounter (Signed)
Ms. Kristina Gonzales calling due to having a cough and noticing blood in the tissue when she blew her nose this morning. She also reports bleeding from her gums when flossing. She would like an antibiotic called in. I advised her that since she is on Xarelto she may bleed more/ easier than normal but that Dr. Ladona Ridgelaylor would not treat a cough/congestion. She [place] [facility] and she could not go to see them. I advised her to go seek treatment at an urgent care and to take her list of medications with her. She verbalizes understanding.

## 2017-01-04 ENCOUNTER — Telehealth: Payer: Self-pay | Admitting: Cardiology

## 2017-01-04 NOTE — Telephone Encounter (Signed)
Patient son called and stated that patient has been dizzy. He bought her a blood pressure cuff and told patient to check her blood pressure every morning. Pt has done this and her blood pressure has been fine. Pt son believes that she is having inner ear trouble. Pt is going to bring blood pressure readings with her to her appointment on 01-11-17.

## 2017-01-11 ENCOUNTER — Encounter: Payer: Self-pay | Admitting: Internal Medicine

## 2017-01-11 ENCOUNTER — Ambulatory Visit (INDEPENDENT_AMBULATORY_CARE_PROVIDER_SITE_OTHER): Payer: Medicare Other | Admitting: Internal Medicine

## 2017-01-11 VITALS — BP 178/84 | HR 96 | Ht 62.0 in | Wt 147.0 lb

## 2017-01-11 DIAGNOSIS — Z950 Presence of cardiac pacemaker: Secondary | ICD-10-CM

## 2017-01-11 DIAGNOSIS — I5031 Acute diastolic (congestive) heart failure: Secondary | ICD-10-CM | POA: Diagnosis not present

## 2017-01-11 DIAGNOSIS — I480 Paroxysmal atrial fibrillation: Secondary | ICD-10-CM | POA: Diagnosis not present

## 2017-01-11 DIAGNOSIS — I495 Sick sinus syndrome: Secondary | ICD-10-CM

## 2017-01-11 LAB — CUP PACEART INCLINIC DEVICE CHECK
Battery Remaining Longevity: 177 mo
Brady Statistic AP VS Percent: 10.93 %
Brady Statistic AS VP Percent: 0.24 %
Brady Statistic RA Percent Paced: 9.52 %
Brady Statistic RV Percent Paced: 1.73 %
Date Time Interrogation Session: 20180608133637
Implantable Lead Implant Date: 20180509
Implantable Lead Implant Date: 20180509
Implantable Lead Location: 753859
Implantable Lead Location: 753860
Implantable Lead Model: 3830
Lead Channel Impedance Value: 342 Ohm
Lead Channel Pacing Threshold Amplitude: 0.5 V
Lead Channel Pacing Threshold Pulse Width: 0.4 ms
Lead Channel Sensing Intrinsic Amplitude: 4.25 mV
Lead Channel Setting Pacing Amplitude: 3.5 V
Lead Channel Setting Sensing Sensitivity: 2 mV
MDC IDC MSMT BATTERY VOLTAGE: 3.2 V
MDC IDC MSMT LEADCHNL RA IMPEDANCE VALUE: 304 Ohm
MDC IDC MSMT LEADCHNL RA IMPEDANCE VALUE: 456 Ohm
MDC IDC MSMT LEADCHNL RA SENSING INTR AMPL: 1.125 mV
MDC IDC MSMT LEADCHNL RV IMPEDANCE VALUE: 646 Ohm
MDC IDC MSMT LEADCHNL RV PACING THRESHOLD AMPLITUDE: 1.75 V
MDC IDC MSMT LEADCHNL RV PACING THRESHOLD PULSEWIDTH: 1 ms
MDC IDC PG IMPLANT DT: 20180509
MDC IDC SET LEADCHNL RV PACING AMPLITUDE: 3 V
MDC IDC SET LEADCHNL RV PACING PULSEWIDTH: 1 ms
MDC IDC STAT BRADY AP VP PERCENT: 0.5 %
MDC IDC STAT BRADY AS VS PERCENT: 88.37 %

## 2017-01-11 MED ORDER — FLECAINIDE ACETATE 150 MG PO TABS: ORAL_TABLET | ORAL | 3 refills | Status: DC

## 2017-01-11 MED ORDER — METOPROLOL SUCCINATE ER 50 MG PO TB24
50.0000 mg | ORAL_TABLET | Freq: Two times a day (BID) | ORAL | 3 refills | Status: DC
Start: 2017-01-11 — End: 2017-07-09

## 2017-01-11 NOTE — Progress Notes (Signed)
HPI [name] returns for followup of atrial fib and flutter and symptomatic bradycardia,s/p PPM insertion.  Since she underwent PPM insertion, she has had sob, anxiety and dizziness. She does not have chest pain. She has not had syncope or near syncope. SHe has noted peripheral edema and anorexia.   Allergies  Allergen Reactions  . Other     Seasonal allergies - runny nose      Current Outpatient Prescriptions  Medication Sig Dispense Refill  . COD LIVER OIL/VITAMINS A & D CAPS Take 1 capsule by mouth daily.    Marland Kitchen. Dextromethorphan-Guaifenesin (CORICIDIN HBP CONGESTION/COUGH PO) Take 1 tablet by mouth 2 (two) times daily as needed (alleriges).    . ezetimibe (ZETIA) 10 MG tablet Take 10 mg by mouth daily.    Marland Kitchen. losartan (COZAAR) 100 MG tablet Take 100 mg by mouth daily with supper.     . Multiple Vitamin (MULTIVITAMIN WITH MINERALS) TABS tablet Take 1 tablet by mouth daily.    [name]. Polyethyl Glycol-Propyl Glycol (SYSTANE OP) Apply 1 drop to eye 2 (two) times daily.    . Sodium Fluoride (PREVIDENT 5000 BOOSTER PLUS) 1.1 % PSTE Place 1 application onto teeth at bedtime.     [name]. XARELTO 20 MG TABS tablet Take 20 mg by mouth daily with supper.   10  . flecainide (TAMBOCOR) 150 MG tablet Take 1/2 tablet (75 mg) by mouth twice daily 90 tablet 3  . metoprolol succinate (TOPROL-XL) 50 MG 24 hr tablet Take 1 tablet (50 mg total) by mouth 2 (two) times daily. Take with or immediately following a meal. 180 tablet 3  . spironolactone-hydrochlorothiazide (ALDACTAZIDE) 25-25 MG tablet Take 0.5 tablets by mouth daily with supper.      No current facility-administered medications for this visit.      Past Medical History:  Diagnosis Date  . Acute pain of right foot   . Anticipatory grieving    06/22/15  . Arthritis    "a little in my right knee" (12/12/2016)  . Atrial flutter (HCC)   . Chronic bronchitis (HCC)    "less now since I've retired" (12/12/2016)  . Dizziness   . History of blood  transfusion    "related to OR" (12/12/2016)  . Hyperlipidemia   . Hypertension   . Irregular heartbeat   . Palpitations   . Presence of permanent cardiac pacemaker   . Stress at home   . Tachy-brady syndrome (HCC)   . Vitamin D deficiency     ROS:   All systems reviewed and negative except as noted in the HPI.   Past Surgical History:  Procedure Laterality Date  . ABDOMINAL HYSTERECTOMY     "partial"  . CHOLECYSTECTOMY OPEN    . INSERT / REPLACE / REMOVE PACEMAKER  12/12/2016  . PACEMAKER IMPLANT N/A 12/12/2016   Procedure: Pacemaker Implant;  Surgeon: Marinus Mawaylor, Gregg W, MD;  [facility];  Service: Cardiovascular;  Laterality: N/A;  . TONSILLECTOMY AND ADENOIDECTOMY       Family History  Problem Relation Age of Onset  . Hypertension Mother        UNDER HOSPICE CARE  . Diverticulosis Mother   . Other Father 74       MYOCARDIAL INFARCTION  . Other Other        PT HAS SIBLINGS X 7     Social History   Social History  . Marital status: Widowed    Spouse name: N/A  . Number  of children: 2  . Years of education: N/A   Occupational History  . RETIRED    Social History Main Topics  . Smoking status: Never Smoker  . Smokeless tobacco: Never Used  . Alcohol use No  . Drug use: No  . Sexual activity: No   Other Topics Concern  . Not on file   Social History Narrative  . No narrative on file     BP (!) 178/84   Pulse 96   Ht 5\' 2"  (1.575 m)   Wt 147 lb (66.7 kg)   SpO2 94%   BMI 26.89 kg/m   Physical Exam:  Well appearing 76 yo woman, NAD HEENT: Unremarkable Neck:  6 cm JVD, no thyromegally Lymphatics:  No adenopathy Back:  No CVA tenderness Lungs:  Clear with no wheezes HEART:  IRegular tachy rhythm, no murmurs, no rubs, no clicks Abd:  soft, positive bowel sounds, no organomegally, no rebound, no guarding Ext:  2 plus pulses, 1+ edema, no cyanosis, no clubbing Skin:  No rashes no nodules Neuro:  CN II through XII intact, motor  grossly intact  PPM interrogation - mostly atrial fib with a RVR. Threshold is ok.    Assess/Plan: 1. Atrial fib with a RVR - I have discussed the treatment options with the patient . She has a RVR and I have recommended uptitration of her toprol to 50 mg twice daily and starting flecainide 75 mg twice daily. I will have her come in for a 12 lead ecg in a 1 week and to see me back in 2 weeks. 2. Coags - she is taking Xarelto and appears to be tolerating this medication nicely 3. Sinus node dysfunction - she is asymptomatic, s/p PPM insertion. 4. HTN heart disease - her blood pressure is up. I have asked her to uptitrate her toprol. If that does not lower her pressure, we would consider coreg. 5. Acute diastolic heart failure - her symptoms are class 2-3. I have asked her to go back on her diuretic. Will consider lasix if not improved with NSR.  Lewayne Bunting, M.D.

## 2017-01-11 NOTE — Patient Instructions (Addendum)
Medication Instructions:  Your physician has recommended you make the following change in your medication:  RESTART - Aldactazide 25-25 mg 1/2 tablet daily INCREASE Toprol XL to 50 mg twice daily START Flecainide 75 mg (1/2 tablet) twice daily  Labwork: None Ordered   Testing/Procedures: EKG Nurse Visit - 1 week   Follow-Up: Your physician recommends that you schedule a follow-up appointment in: 2 weeks with Dr. Ladona Ridgelaylor   Any Other Special Instructions Will Be Listed Below (If Applicable).     If you need a refill on your cardiac medications before your next appointment, please call your pharmacy.

## 2017-01-15 ENCOUNTER — Telehealth: Payer: Self-pay | Admitting: Internal Medicine

## 2017-01-15 NOTE — Telephone Encounter (Signed)
New message    Pt is calling stating she has been waiting on a phone call from the nurse about coming in this week. She said she is not available on Thursday

## 2017-01-15 NOTE — Telephone Encounter (Signed)
Called, spoke with pt. Informed pt is scheduled for nurse visit EKG on 01/18/17, arriving at 10:45 AM. Pt verbalized understanding.

## 2017-01-18 ENCOUNTER — Ambulatory Visit (INDEPENDENT_AMBULATORY_CARE_PROVIDER_SITE_OTHER): Payer: Medicare Other | Admitting: *Deleted

## 2017-01-18 VITALS — BP 162/86 | HR 74 | Resp 16 | Ht 62.0 in

## 2017-01-18 DIAGNOSIS — I10 Essential (primary) hypertension: Secondary | ICD-10-CM | POA: Diagnosis not present

## 2017-01-18 DIAGNOSIS — Z79899 Other long term (current) drug therapy: Secondary | ICD-10-CM

## 2017-01-18 MED ORDER — FUROSEMIDE 20 MG PO TABS: 20.0000 mg | ORAL_TABLET | Freq: Every day | ORAL | 6 refills | Status: DC

## 2017-01-18 NOTE — Patient Instructions (Signed)
Medication Instructions:  Please start Furosemide 20 mg a day. Continue all other medications as listed.  Labwork: Please have blood work at next office visit.  Testing/Procedures: None  Follow-Up: Follow up as scheduled with Dr Ladona Ridgelaylor.  Thank you for choosing [facility]!!

## 2017-01-31 ENCOUNTER — Encounter: Payer: Self-pay | Admitting: Internal Medicine

## 2017-01-31 ENCOUNTER — Ambulatory Visit (INDEPENDENT_AMBULATORY_CARE_PROVIDER_SITE_OTHER): Payer: Medicare Other | Admitting: Internal Medicine

## 2017-01-31 ENCOUNTER — Other Ambulatory Visit: Payer: Medicare Other

## 2017-01-31 VITALS — BP 142/82 | HR 89 | Ht 62.0 in | Wt 140.4 lb

## 2017-01-31 DIAGNOSIS — Z950 Presence of cardiac pacemaker: Secondary | ICD-10-CM

## 2017-01-31 DIAGNOSIS — I480 Paroxysmal atrial fibrillation: Secondary | ICD-10-CM

## 2017-01-31 MED ORDER — LOSARTAN POTASSIUM 100 MG PO TABS: 100.0000 mg | ORAL_TABLET | Freq: Every day | ORAL | 11 refills | Status: AC

## 2017-01-31 MED ORDER — FUROSEMIDE 40 MG PO TABS: 40.0000 mg | ORAL_TABLET | Freq: Every day | ORAL | 11 refills | Status: AC

## 2017-01-31 MED ORDER — FUROSEMIDE 40 MG PO TABS: 40.0000 mg | ORAL_TABLET | Freq: Every day | ORAL | 3 refills | Status: DC

## 2017-01-31 NOTE — Progress Notes (Signed)
HPI Kristina Gonzales returns for followup of atrial fib and flutter and symptomatic bradycardia,s/p PPM insertion.  Since she underwent PPM insertion, she has had sob, anxiety and dizziness and atrial fib and I had asked her to start flecainide therapy.  She does not have chest pain. She has not had syncope or near syncope. She has had an improvement in her peripheral edema and anorexia. She has tolerated her beta blocker and flecainide.   Allergies  Allergen Reactions  . Other     Seasonal allergies - runny nose      Current Outpatient Prescriptions  Medication Sig Dispense Refill  . COD LIVER OIL/VITAMINS A & D CAPS Take 1 capsule by mouth daily.    Marland Kitchen Dextromethorphan-Guaifenesin (CORICIDIN HBP CONGESTION/COUGH PO) Take 1 tablet by mouth 2 (two) times daily as needed (alleriges).    . ezetimibe (ZETIA) 10 MG tablet Take 10 mg by mouth daily.    . flecainide (TAMBOCOR) 150 MG tablet Take 1/2 tablet (75 mg) by mouth twice daily 90 tablet 3  . losartan (COZAAR) 100 MG tablet Take 1 tablet (100 mg total) by mouth daily with supper. 30 tablet 11  . metoprolol succinate (TOPROL-XL) 50 MG 24 hr tablet Take 1 tablet (50 mg total) by mouth 2 (two) times daily. Take with or immediately following a meal. 180 tablet 3  . Multiple Vitamin (MULTIVITAMIN WITH MINERALS) TABS tablet Take 1 tablet by mouth daily.    Kristina Gonzales Glycol-Propyl Glycol (SYSTANE OP) Apply 1 drop to eye 2 (two) times daily.    . Sodium Fluoride (PREVIDENT 5000 BOOSTER PLUS) 1.1 % PSTE Place 1 application onto teeth at bedtime.     Kristina Gonzales 20 MG TABS tablet Take 20 mg by mouth daily with supper.   10  . furosemide (LASIX) 40 MG tablet Take 1 tablet (40 mg total) by mouth daily. 30 tablet 11   No current facility-administered medications for this visit.      Past Medical History:  Diagnosis Date  . Acute pain of right foot   . Anticipatory grieving    06/22/15  . Arthritis    "a little in my right knee" (12/12/2016)    . Atrial flutter (HCC)   . Chronic bronchitis (HCC)    "less now since I've retired" (12/12/2016)  . Dizziness   . History of blood transfusion    "related to OR" (12/12/2016)  . Hyperlipidemia   . Hypertension   . Irregular heartbeat   . Palpitations   . Presence of permanent cardiac pacemaker   . Stress at home   . Tachy-brady syndrome (HCC)   . Vitamin D deficiency     ROS:   All systems reviewed and negative except as noted in the HPI.   Past Surgical History:  Procedure Laterality Date  . ABDOMINAL HYSTERECTOMY     "partial"  . CHOLECYSTECTOMY OPEN    . INSERT / REPLACE / REMOVE PACEMAKER  12/12/2016  . PACEMAKER IMPLANT N/A 12/12/2016   Procedure: Pacemaker Implant;  Surgeon: Marinus Maw, MD;  Location: [facility] INVASIVE CV LAB;  Service: Cardiovascular;  Laterality: N/A;  . TONSILLECTOMY AND ADENOIDECTOMY       Family History  Problem Relation Age of Onset  . Hypertension Mother        UNDER HOSPICE CARE  . Diverticulosis Mother   . Other Father 48       MYOCARDIAL INFARCTION  . Other Other  PT HAS SIBLINGS X 7     Social History   Social History  . Marital status: Widowed    Spouse name: N/A  . Number of children: 2  . Years of education: N/A   Occupational History  . RETIRED    Social History Main Topics  . Smoking status: Never Smoker  . Smokeless tobacco: Never Used  . Alcohol use No  . Drug use: No  . Sexual activity: No   Other Topics Concern  . Not on file   Social History Narrative  . No narrative on file     BP (!) 142/82   Pulse 89   Ht 5\' 2"  (1.575 m)   Wt 140 lb 6.4 oz (63.7 kg)   BMI 25.68 kg/m   Physical Exam:  Well appearing 76 yo woman, NAD HEENT: Unremarkable Neck:  6 cm JVD, no thyromegally Lymphatics:  No adenopathy Back:  No CVA tenderness Lungs:  Clear with no wheezes HEART:  IRegular tachy rhythm, no murmurs, no rubs, no clicks Abd:  soft, positive bowel sounds, no organomegally, no rebound, no  guarding Ext:  2 plus pulses, 1+ edema, no cyanosis, no clubbing Skin:  No rashes no nodules Neuro:  CN II through XII intact, motor grossly intact  PPM interrogation - her atrial fib is improved since starting her flecainide.    Assess/Plan: 1. Atrial fib with a RVR -she is much improved. She will continue flecainide. 2. Coags - she is taking Xarelto and appears to be tolerating this medication nicely 3. Sinus node dysfunction - she is asymptomatic, s/p PPM insertion. 4. HTN heart disease - her blood pressure is up. I have asked her to uptitrate her toprol. If that does not lower her pressure, we would consider coreg. 5. Acute diastolic heart failure - her symptoms are class 2 in NSR, approaching class 1. I have asked her to go back on her diuretic. She will start lasix and stop her HCTZ.    Kristina BuntingGregg Belen Zwahlen, M.D.

## 2017-01-31 NOTE — Patient Instructions (Addendum)
Medication Instructions:  Your physician has recommended you make the following change in your medication:  STOP Aldactazide  INCREASE Lasix to 40 mg daily   Labwork: None Ordered   Testing/Procedures: None Ordered   Follow-Up: Your physician wants you to follow-up in: Week of Thanksgiving 2018 with Dr. Ladona Ridgel. You will receive a reminder letter in the mail two months in advance. If you don't receive a letter, please call our office to schedule the follow-up appointment.     Any Other Special Instructions Will Be Listed Below (If Applicable).   DASH Eating Plan DASH stands for "Dietary Approaches to Stop Hypertension." The DASH eating plan is a healthy eating plan that has been shown to reduce high blood pressure (hypertension). It may also reduce your risk for type 2 diabetes, heart disease, and stroke. The DASH eating plan may also help with weight loss. What are tips for following this plan? General guidelines  Avoid eating more than 2,300 mg (milligrams) of salt (sodium) a day. If you have hypertension, you may need to reduce your sodium intake to 1,500 mg a day.  Limit alcohol intake to no more than 1 drink a day for nonpregnant women and 2 drinks a day for men. One drink equals 12 oz of beer, 5 oz of wine, or 1 oz of hard liquor.  Work with your health care provider to maintain a healthy body weight or to lose weight. Ask what an ideal weight is for you.  Get at least 30 minutes of exercise that causes your heart to beat faster (aerobic exercise) most days of the week. Activities may include walking, swimming, or biking.  Work with your health care provider or diet and nutrition specialist (dietitian) to adjust your eating plan to your individual calorie needs. Reading food labels  Check food labels for the amount of sodium per serving. Choose foods with less than 5 percent of the Daily Value of sodium. Generally, foods with less than 300 mg of sodium per serving fit  into this eating plan.  To find whole grains, look for the word "whole" as the first word in the ingredient list. Shopping  Buy products labeled as "low-sodium" or "no salt added."  Buy fresh foods. Avoid canned foods and premade or frozen meals. Cooking  Avoid adding salt when cooking. Use salt-free seasonings or herbs instead of table salt or sea salt. Check with your health care provider or pharmacist before using salt substitutes.  Do not fry foods. Cook foods using healthy methods such as baking, boiling, grilling, and broiling instead.  Cook with heart-healthy oils, such as olive, canola, soybean, or sunflower oil. Meal planning   Eat a balanced diet that includes: ? 5 or more servings of fruits and vegetables each day. At each meal, try to fill half of your plate with fruits and vegetables. ? Up to 6-8 servings of whole grains each day. ? Less than 6 oz of lean meat, poultry, or fish each day. A 3-oz serving of meat is about the same size as a deck of cards. One egg equals 1 oz. ? 2 servings of low-fat dairy each day. ? A serving of nuts, seeds, or beans 5 times each week. ? Heart-healthy fats. Healthy fats called Omega-3 fatty acids are found in foods such as flaxseeds and coldwater fish, like sardines, salmon, and mackerel.  Limit how much you eat of the following: ? Canned or prepackaged foods. ? Food that is high in trans fat, such as fried foods. ?  Food that is high in saturated fat, such as fatty meat. ? Sweets, desserts, sugary drinks, and other foods with added sugar. ? Full-fat dairy products.  Do not salt foods before eating.  Try to eat at least 2 vegetarian meals each week.  Eat more home-cooked food and less restaurant, buffet, and fast food.  When eating at a restaurant, ask that your food be prepared with less salt or no salt, if possible. What foods are recommended? The items listed may not be a complete list. Talk with your dietitian about what dietary  choices are best for you. Grains Whole-grain or whole-wheat bread. Whole-grain or whole-wheat pasta. Brown rice. Orpah Cobb. Bulgur. Whole-grain and low-sodium cereals. Pita bread. Low-fat, low-sodium crackers. Whole-wheat flour tortillas. Vegetables Fresh or frozen vegetables (raw, steamed, roasted, or grilled). Low-sodium or reduced-sodium tomato and vegetable juice. Low-sodium or reduced-sodium tomato sauce and tomato paste. Low-sodium or reduced-sodium canned vegetables. Fruits All fresh, dried, or frozen fruit. Canned fruit in natural juice (without added sugar). Meat and other protein foods Skinless chicken or [place]. Ground chicken or [place]. Pork with fat trimmed off. Fish and seafood. Egg whites. Dried beans, peas, or lentils. Unsalted nuts, nut butters, and seeds. Unsalted canned beans. Lean cuts of beef with fat trimmed off. Low-sodium, lean deli meat. Dairy Low-fat (1%) or fat-free (skim) milk. Fat-free, low-fat, or reduced-fat cheeses. Nonfat, low-sodium ricotta or cottage cheese. Low-fat or nonfat yogurt. Low-fat, low-sodium cheese. Fats and oils Soft margarine without trans fats. Vegetable oil. Low-fat, reduced-fat, or light mayonnaise and salad dressings (reduced-sodium). Canola, safflower, olive, soybean, and sunflower oils. Avocado. Seasoning and other foods Herbs. Spices. Seasoning mixes without salt. Unsalted popcorn and pretzels. Fat-free sweets. What foods are not recommended? The items listed may not be a complete list. Talk with your dietitian about what dietary choices are best for you. Grains Baked goods made with fat, such as croissants, muffins, or some breads. Dry pasta or rice meal packs. Vegetables Creamed or fried vegetables. Vegetables in a cheese sauce. Regular canned vegetables (not low-sodium or reduced-sodium). Regular canned tomato sauce and paste (not low-sodium or reduced-sodium). Regular tomato and vegetable juice (not low-sodium or reduced-sodium).  Rosita Fire. Olives. Fruits Canned fruit in a light or heavy syrup. Fried fruit. Fruit in cream or butter sauce. Meat and other protein foods Fatty cuts of meat. Ribs. Fried meat. Tomasa Blase. Sausage. Bologna and other processed lunch meats. Salami. Fatback. Hotdogs. Bratwurst. Salted nuts and seeds. Canned beans with added salt. Canned or smoked fish. Whole eggs or egg yolks. Chicken or [place] with skin. Dairy Whole or 2% milk, cream, and half-and-half. Whole or full-fat cream cheese. Whole-fat or sweetened yogurt. Full-fat cheese. Nondairy creamers. Whipped toppings. Processed cheese and cheese spreads. Fats and oils Butter. Stick margarine. Lard. Shortening. Ghee. Bacon fat. Tropical oils, such as coconut, palm kernel, or palm oil. Seasoning and other foods Salted popcorn and pretzels. Onion salt, garlic salt, seasoned salt, table salt, and sea salt. Worcestershire sauce. Tartar sauce. Barbecue sauce. Teriyaki sauce. Soy sauce, including reduced-sodium. Steak sauce. Canned and packaged gravies. Fish sauce. Oyster sauce. Cocktail sauce. Horseradish that you find on the shelf. Ketchup. Mustard. Meat flavorings and tenderizers. Bouillon cubes. Hot sauce and Tabasco sauce. Premade or packaged marinades. Premade or packaged taco seasonings. Relishes. Regular salad dressings. Where to find more information:  National Heart, Lung, and Blood Institute: PopSteam.is  American Heart Association: www.heart.org Summary  The DASH eating plan is a healthy eating plan that has been shown to reduce high blood  pressure (hypertension). It may also reduce your risk for type 2 diabetes, heart disease, and stroke.  With the DASH eating plan, you should limit salt (sodium) intake to 2,300 mg a day. If you have hypertension, you may need to reduce your sodium intake to 1,500 mg a day.  When on the DASH eating plan, aim to eat more fresh fruits and vegetables, whole grains, lean proteins, low-fat dairy, and  heart-healthy fats.  Work with your health care provider or diet and nutrition specialist (dietitian) to adjust your eating plan to your individual calorie needs. This information is not intended to replace advice given to you by your health care provider. Make sure you discuss any questions you have with your health care provider. Document Released: 07/12/2011 Document Revised: 07/16/2016 Document Reviewed: 07/16/2016 Elsevier Interactive Patient Education  2017 [employer]    If you need a refill on your cardiac medications before your next appointment, please call your pharmacy.

## 2017-03-26 ENCOUNTER — Encounter: Payer: Medicare Other | Admitting: Internal Medicine

## 2017-04-01 ENCOUNTER — Telehealth: Payer: Self-pay | Admitting: Internal Medicine

## 2017-04-01 NOTE — Telephone Encounter (Signed)
Walk In pt Form-[employer],Accident,Disability Claim form dropped off by patient. Placed in Dr.Taylor doc box/KM

## 2017-04-02 ENCOUNTER — Telehealth: Payer: Self-pay | Admitting: Internal Medicine

## 2017-04-02 NOTE — Telephone Encounter (Signed)
Son Dr.Selig Doung called asking once paperwork he dropped off gets completed. Please call him for pick up @ [phone].

## 2017-04-03 ENCOUNTER — Telehealth: Payer: Self-pay

## 2017-04-03 NOTE — Telephone Encounter (Signed)
Documentation completed for pt.  Call placed to son Dr. Excell Seltzerooper.  Notified documentation complete and ready for pick up.  Dr. Excell Seltzerooper indicates understanding-will pick up from front desk @ 1:30 pm.

## 2017-06-26 ENCOUNTER — Telehealth: Payer: Self-pay | Admitting: Internal Medicine

## 2017-06-26 ENCOUNTER — Telehealth: Payer: Self-pay | Admitting: *Deleted

## 2017-06-26 NOTE — Telephone Encounter (Signed)
Pt cxl appt on Monday-will be out of town for 2 weeks, during trip, monitor was misplaced.

## 2017-06-26 NOTE — Telephone Encounter (Signed)
Patient calling, states that  °

## 2017-07-01 ENCOUNTER — Encounter: Payer: Medicare Other | Admitting: Internal Medicine

## 2017-07-02 ENCOUNTER — Ambulatory Visit (INDEPENDENT_AMBULATORY_CARE_PROVIDER_SITE_OTHER): Payer: Medicare Other | Admitting: *Deleted

## 2017-07-02 DIAGNOSIS — I495 Sick sinus syndrome: Secondary | ICD-10-CM | POA: Diagnosis not present

## 2017-07-03 NOTE — Progress Notes (Signed)
Remote pacemaker transmission.   

## 2017-07-05 ENCOUNTER — Encounter: Payer: Self-pay | Admitting: Cardiology

## 2017-07-05 LAB — CUP PACEART REMOTE DEVICE CHECK
Battery Remaining Longevity: 160 mo
Battery Voltage: 3.12 V
Brady Statistic AS VP Percent: 0 %
Date Time Interrogation Session: 20181128032329
Implantable Lead Implant Date: 20180509
Implantable Lead Location: 753859
Implantable Lead Model: 5076
Lead Channel Impedance Value: 304 Ohm
Lead Channel Pacing Threshold Amplitude: 0.75 V
Lead Channel Pacing Threshold Pulse Width: 0.4 ms
Lead Channel Pacing Threshold Pulse Width: 0.4 ms
Lead Channel Sensing Intrinsic Amplitude: 2.5 mV
Lead Channel Sensing Intrinsic Amplitude: 2.5 mV
Lead Channel Sensing Intrinsic Amplitude: 5.625 mV
MDC IDC LEAD IMPLANT DT: 20180509
MDC IDC LEAD LOCATION: 753860
MDC IDC MSMT LEADCHNL RA IMPEDANCE VALUE: 475 Ohm
MDC IDC MSMT LEADCHNL RV IMPEDANCE VALUE: 323 Ohm
MDC IDC MSMT LEADCHNL RV IMPEDANCE VALUE: 627 Ohm
MDC IDC MSMT LEADCHNL RV PACING THRESHOLD AMPLITUDE: 2.375 V
MDC IDC MSMT LEADCHNL RV SENSING INTR AMPL: 5.625 mV
MDC IDC PG IMPLANT DT: 20180509
MDC IDC SET LEADCHNL RA PACING AMPLITUDE: 1.75 V
MDC IDC SET LEADCHNL RV PACING AMPLITUDE: 3 V
MDC IDC SET LEADCHNL RV PACING PULSEWIDTH: 1 ms
MDC IDC SET LEADCHNL RV SENSING SENSITIVITY: 2 mV
MDC IDC STAT BRADY AP VP PERCENT: 0.01 %
MDC IDC STAT BRADY AP VS PERCENT: 84.73 %
MDC IDC STAT BRADY AS VS PERCENT: 15.26 %
MDC IDC STAT BRADY RA PERCENT PACED: 84.45 %
MDC IDC STAT BRADY RV PERCENT PACED: 0.05 %

## 2017-07-09 ENCOUNTER — Other Ambulatory Visit: Payer: Self-pay | Admitting: Internal Medicine

## 2017-07-09 MED ORDER — FLECAINIDE ACETATE 150 MG PO TABS: ORAL_TABLET | ORAL | 1 refills | Status: DC

## 2017-07-09 MED ORDER — METOPROLOL SUCCINATE ER 50 MG PO TB24: 50.0000 mg | ORAL_TABLET | Freq: Two times a day (BID) | ORAL | 1 refills | Status: DC

## 2017-07-09 NOTE — Telephone Encounter (Signed)
Pt's medication was sent to pt's pharmacy as requested. Confirmation received.  °

## 2017-10-01 ENCOUNTER — Ambulatory Visit (INDEPENDENT_AMBULATORY_CARE_PROVIDER_SITE_OTHER): Payer: Medicare Other | Admitting: *Deleted

## 2017-10-01 DIAGNOSIS — I495 Sick sinus syndrome: Secondary | ICD-10-CM | POA: Diagnosis not present

## 2017-10-01 NOTE — Progress Notes (Signed)
Remote pacemaker transmission.   

## 2017-10-03 ENCOUNTER — Encounter: Payer: Self-pay | Admitting: Cardiology

## 2017-10-16 LAB — CUP PACEART REMOTE DEVICE CHECK
Battery Voltage: 3.08 V
Brady Statistic AP VP Percent: 0.01 %
Brady Statistic AS VP Percent: 0 %
Brady Statistic RA Percent Paced: 84.17 %
Brady Statistic RV Percent Paced: 0.02 %
Implantable Lead Implant Date: 20180509
Implantable Lead Model: 3830
Implantable Lead Model: 5076
Implantable Pulse Generator Implant Date: 20180509
Lead Channel Impedance Value: 304 Ohm
Lead Channel Impedance Value: 323 Ohm
Lead Channel Impedance Value: 551 Ohm
Lead Channel Pacing Threshold Amplitude: 0.75 V
Lead Channel Sensing Intrinsic Amplitude: 2.125 mV
Lead Channel Sensing Intrinsic Amplitude: 5.25 mV
Lead Channel Setting Pacing Amplitude: 1.75 V
Lead Channel Setting Pacing Pulse Width: 1 ms
Lead Channel Setting Sensing Sensitivity: 2 mV
MDC IDC LEAD IMPLANT DT: 20180509
MDC IDC LEAD LOCATION: 753859
MDC IDC LEAD LOCATION: 753860
MDC IDC MSMT BATTERY REMAINING LONGEVITY: 157 mo
MDC IDC MSMT LEADCHNL RA IMPEDANCE VALUE: 494 Ohm
MDC IDC MSMT LEADCHNL RA PACING THRESHOLD PULSEWIDTH: 0.4 ms
MDC IDC MSMT LEADCHNL RA SENSING INTR AMPL: 2.125 mV
MDC IDC MSMT LEADCHNL RV PACING THRESHOLD AMPLITUDE: 2.375 V
MDC IDC MSMT LEADCHNL RV PACING THRESHOLD PULSEWIDTH: 0.4 ms
MDC IDC MSMT LEADCHNL RV SENSING INTR AMPL: 5.25 mV
MDC IDC SESS DTM: 20190226054920
MDC IDC SET LEADCHNL RV PACING AMPLITUDE: 3 V
MDC IDC STAT BRADY AP VS PERCENT: 84.15 %
MDC IDC STAT BRADY AS VS PERCENT: 15.84 %

## 2018-01-01 ENCOUNTER — Ambulatory Visit (INDEPENDENT_AMBULATORY_CARE_PROVIDER_SITE_OTHER): Payer: Medicare Other | Admitting: *Deleted

## 2018-01-01 DIAGNOSIS — I10 Essential (primary) hypertension: Secondary | ICD-10-CM

## 2018-01-01 DIAGNOSIS — I495 Sick sinus syndrome: Secondary | ICD-10-CM

## 2018-01-01 NOTE — Progress Notes (Signed)
Remote pacemaker transmission.   

## 2018-01-02 LAB — CUP PACEART REMOTE DEVICE CHECK
Battery Remaining Longevity: 152 mo
Battery Voltage: 3.05 V
Brady Statistic AP VP Percent: 0.05 %
Brady Statistic RA Percent Paced: 87.69 %
Brady Statistic RV Percent Paced: 0.43 %
Implantable Lead Implant Date: 20180509
Implantable Lead Implant Date: 20180509
Implantable Lead Location: 753859
Implantable Lead Location: 753860
Implantable Lead Model: 3830
Implantable Pulse Generator Implant Date: 20180509
Lead Channel Impedance Value: 646 Ohm
Lead Channel Pacing Threshold Amplitude: 0.875 V
Lead Channel Pacing Threshold Pulse Width: 0.4 ms
Lead Channel Sensing Intrinsic Amplitude: 2.25 mV
Lead Channel Setting Pacing Amplitude: 1.75 V
Lead Channel Setting Pacing Amplitude: 3 V
Lead Channel Setting Pacing Pulse Width: 1 ms
Lead Channel Setting Sensing Sensitivity: 2 mV
MDC IDC MSMT LEADCHNL RA IMPEDANCE VALUE: 304 Ohm
MDC IDC MSMT LEADCHNL RA IMPEDANCE VALUE: 456 Ohm
MDC IDC MSMT LEADCHNL RA SENSING INTR AMPL: 2.25 mV
MDC IDC MSMT LEADCHNL RV IMPEDANCE VALUE: 323 Ohm
MDC IDC MSMT LEADCHNL RV PACING THRESHOLD AMPLITUDE: 2.375 V
MDC IDC MSMT LEADCHNL RV PACING THRESHOLD PULSEWIDTH: 0.4 ms
MDC IDC MSMT LEADCHNL RV SENSING INTR AMPL: 5.375 mV
MDC IDC MSMT LEADCHNL RV SENSING INTR AMPL: 5.375 mV
MDC IDC SESS DTM: 20190529055634
MDC IDC STAT BRADY AP VS PERCENT: 89.12 %
MDC IDC STAT BRADY AS VP PERCENT: 0 %
MDC IDC STAT BRADY AS VS PERCENT: 10.82 %

## 2018-04-02 ENCOUNTER — Ambulatory Visit (INDEPENDENT_AMBULATORY_CARE_PROVIDER_SITE_OTHER): Payer: Medicare Other | Admitting: *Deleted

## 2018-04-02 DIAGNOSIS — I495 Sick sinus syndrome: Secondary | ICD-10-CM

## 2018-04-02 NOTE — Progress Notes (Signed)
Remote pacemaker transmission.   

## 2018-04-09 ENCOUNTER — Other Ambulatory Visit: Payer: Self-pay | Admitting: *Deleted

## 2018-04-09 MED ORDER — METOPROLOL SUCCINATE ER 50 MG PO TB24: 50.0000 mg | ORAL_TABLET | Freq: Two times a day (BID) | ORAL | 0 refills | Status: DC

## 2018-04-22 LAB — CUP PACEART REMOTE DEVICE CHECK
Battery Voltage: 3.04 V
Brady Statistic AP VP Percent: 0.06 %
Brady Statistic AP VS Percent: 87.23 %
Brady Statistic AS VP Percent: 0 %
Brady Statistic RA Percent Paced: 86.22 %
Brady Statistic RV Percent Paced: 0.19 %
Date Time Interrogation Session: 20190828060317
Implantable Lead Location: 753859
Implantable Lead Location: 753860
Implantable Lead Model: 3830
Implantable Lead Model: 5076
Implantable Pulse Generator Implant Date: 20180509
Lead Channel Impedance Value: 304 Ohm
Lead Channel Impedance Value: 323 Ohm
Lead Channel Impedance Value: 589 Ohm
Lead Channel Pacing Threshold Pulse Width: 0.4 ms
Lead Channel Sensing Intrinsic Amplitude: 4.875 mV
Lead Channel Sensing Intrinsic Amplitude: 4.875 mV
Lead Channel Setting Pacing Amplitude: 1.5 V
Lead Channel Setting Pacing Amplitude: 3 V
Lead Channel Setting Pacing Pulse Width: 1 ms
MDC IDC LEAD IMPLANT DT: 20180509
MDC IDC LEAD IMPLANT DT: 20180509
MDC IDC MSMT BATTERY REMAINING LONGEVITY: 151 mo
MDC IDC MSMT LEADCHNL RA IMPEDANCE VALUE: 437 Ohm
MDC IDC MSMT LEADCHNL RA PACING THRESHOLD AMPLITUDE: 0.75 V
MDC IDC MSMT LEADCHNL RA SENSING INTR AMPL: 2.25 mV
MDC IDC MSMT LEADCHNL RA SENSING INTR AMPL: 2.25 mV
MDC IDC MSMT LEADCHNL RV PACING THRESHOLD AMPLITUDE: 2.375 V
MDC IDC MSMT LEADCHNL RV PACING THRESHOLD PULSEWIDTH: 0.4 ms
MDC IDC SET LEADCHNL RV SENSING SENSITIVITY: 2 mV
MDC IDC STAT BRADY AS VS PERCENT: 12.71 %

## 2018-06-25 ENCOUNTER — Other Ambulatory Visit: Payer: Self-pay | Admitting: Internal Medicine

## 2018-07-02 ENCOUNTER — Encounter: Payer: Self-pay | Admitting: Internal Medicine

## 2018-07-02 ENCOUNTER — Ambulatory Visit (INDEPENDENT_AMBULATORY_CARE_PROVIDER_SITE_OTHER): Payer: Medicare Other

## 2018-07-02 ENCOUNTER — Ambulatory Visit: Payer: Medicare Other | Admitting: Internal Medicine

## 2018-07-02 VITALS — BP 140/82 | HR 84 | Ht 62.0 in | Wt 134.0 lb

## 2018-07-02 DIAGNOSIS — R001 Bradycardia, unspecified: Secondary | ICD-10-CM

## 2018-07-02 DIAGNOSIS — I480 Paroxysmal atrial fibrillation: Secondary | ICD-10-CM

## 2018-07-02 DIAGNOSIS — Z950 Presence of cardiac pacemaker: Secondary | ICD-10-CM | POA: Diagnosis not present

## 2018-07-02 DIAGNOSIS — I10 Essential (primary) hypertension: Secondary | ICD-10-CM

## 2018-07-02 DIAGNOSIS — I495 Sick sinus syndrome: Secondary | ICD-10-CM

## 2018-07-02 MED ORDER — METOPROLOL SUCCINATE ER 50 MG PO TB24: 50.0000 mg | ORAL_TABLET | Freq: Two times a day (BID) | ORAL | 3 refills | Status: DC

## 2018-07-02 MED ORDER — PROPAFENONE HCL 225 MG PO TABS: 225.0000 mg | ORAL_TABLET | Freq: Two times a day (BID) | ORAL | 11 refills | Status: DC

## 2018-07-02 NOTE — Patient Instructions (Addendum)
Medication Instructions:  Your physician has recommended you make the following change in your medication:  1.  Stop taking flecainide  2.  Start taking propafenone 225 mg---Take one tablet by mouth twice a day  Labwork: None ordered.  Testing/Procedures: None ordered.  Follow-Up:   You are scheduled to follow up with Dr. Ladona Ridgelaylor on August 04, 2018 at 3:45 pm.  Please arrive 15 minutes early to check in.  Remote monitoring is used to monitor your Pacemaker from home. This monitoring reduces the number of office visits required to check your device to one time per year. It allows us to keep an eye on the functioning of your device to ensure it is working properly. You are scheduled for a device check from home on 10/01/2018. You may send your transmission at any time that day. If you have a wireless device, the transmission will be sent automatically. After your physician reviews your transmission, you will receive a postcard with your next transmission date.  Any Other Special Instructions Will Be Listed Below (If Applicable).  If you need a refill on your cardiac medications before your next appointment, please call your pharmacy.

## 2018-07-02 NOTE — Progress Notes (Signed)
Remote pacemaker transmission.   

## 2018-07-02 NOTE — Progress Notes (Signed)
HPI Kristina Gonzales returns for followup of atrial fib and flutter and symptomatic bradycardia,s/p PPM insertion.  Since she underwent PPM insertion, she has started on flecainide to control her rapid PAF. She has not had syncope or near syncope. She has had an improvement in her peripheral edema and anorexia but still complains of a lack of appetite.  Allergies  Allergen Reactions  . Other     Seasonal allergies - runny nose      Current Outpatient Medications  Medication Sig Dispense Refill  . ezetimibe (ZETIA) 10 MG tablet Take 10 mg by mouth daily.    . flecainide (TAMBOCOR) 150 MG tablet Take 1/2 tablet (75 mg) by mouth twice daily 90 tablet 1  . furosemide (LASIX) 40 MG tablet Take 1 tablet (40 mg total) by mouth daily. 30 tablet 11  . losartan (COZAAR) 100 MG tablet Take 1 tablet (100 mg total) by mouth daily with supper. 30 tablet 11  . metoprolol succinate (TOPROL-XL) 50 MG 24 hr tablet Take 1 tablet (50 mg total) by mouth 2 (two) times daily. Take with or immediately following a meal. 180 tablet 0  . XARELTO 20 MG TABS tablet Take 20 mg by mouth daily with supper.   10   No current facility-administered medications for this visit.      Past Medical History:  Diagnosis Date  . Acute pain of right foot   . Anticipatory grieving    06/22/15  . Arthritis    "a little in my right knee" (12/12/2016)  . Atrial flutter (HCC)   . Chronic bronchitis (HCC)    "less now since I've retired" (12/12/2016)  . Dizziness   . History of blood transfusion    "related to OR" (12/12/2016)  . Hyperlipidemia   . Hypertension   . Irregular heartbeat   . Palpitations   . Presence of permanent cardiac pacemaker   . Stress at home   . Tachy-brady syndrome (HCC)   . Vitamin D deficiency     ROS:   All systems reviewed and negative except as noted in the HPI.   Past Surgical History:  Procedure Laterality Date  . ABDOMINAL HYSTERECTOMY     "partial"  . CHOLECYSTECTOMY OPEN    .  INSERT / REPLACE / REMOVE PACEMAKER  12/12/2016  . PACEMAKER IMPLANT N/A 12/12/2016   Procedure: Pacemaker Implant;  Surgeon: Marinus Mawaylor, Danyael Alipio W, MD;  [facility];  Service: Cardiovascular;  Laterality: N/A;  . TONSILLECTOMY AND ADENOIDECTOMY       Family History  Problem Relation Age of Onset  . Hypertension Mother        UNDER HOSPICE CARE  . Diverticulosis Mother   . Other Father [id number]       MYOCARDIAL INFARCTION  . Other Other        PT HAS SIBLINGS X 7     Social History   Socioeconomic History  . Marital status: Widowed    Spouse name: Not on file  . Number of children: 2  . Years of education: Not on file  . Highest education level: Not on file  Occupational History  . Occupation: RETIRED  Social Needs  . Financial resource strain: Not on file  . Food insecurity:    Worry: Not on file    Inability: Not on file  . Transportation needs:    Medical: Not on file    Non-medical: Not on file  Tobacco Use  . Smoking status:  Never Smoker  . Smokeless tobacco: Never Used  Substance and Sexual Activity  . Alcohol use: No  . Drug use: No  . Sexual activity: Never  Lifestyle  . Physical activity:    Days per week: Not on file    Minutes per session: Not on file  . Stress: Not on file  Relationships  . Social connections:    Talks on phone: Not on file    Gets together: Not on file    Attends religious service: Not on file    Active member of club or organization: Not on file    Attends meetings of clubs or organizations: Not on file    Relationship status: Not on file  . Intimate partner violence:    Fear of current or ex partner: Not on file    Emotionally abused: Not on file    Physically abused: Not on file    Forced sexual activity: Not on file  Other Topics Concern  . Not on file  Social History Narrative  . Not on file     BP 140/82   Pulse 84   Ht 5\' 2"  (1.575 m)   Wt 134 lb (60.8 kg)   BMI 24.51 kg/m   Physical Exam:  Well  appearing NAD HEENT: Unremarkable Neck:  No JVD, no thyromegally Lymphatics:  No adenopathy Back:  No CVA tenderness Lungs:  Clear with no wheezes HEART:  Regular rate rhythm, no murmurs, no rubs, no clicks Abd:  soft, positive bowel sounds, no organomegally, no rebound, no guarding Ext:  2 plus pulses, no edema, no cyanosis, no clubbing Skin:  No rashes no nodules Neuro:  CN II through XII intact, motor grossly intact  EKG - nsr with atrial pacing  DEVICE  Normal device function.  See PaceArt for details.   Assess/Plan: 1. Sinus node dysfunction - she is asymptomatic, s/p PPM insertion.  2. PPM - her medtronic device is working normally. 3. PAF - she is out of rhythm about 15% of the time. 4. Anorexia - she has no appetite though she has not lost weight. I have recommended she stop taking flecainide which I suspect is the culprit and start low dose propafenone and have her return in a week or two to assess her ECG. I will see her back in a couple of months.  Kristina Gonzales.D.

## 2018-07-05 LAB — CUP PACEART INCLINIC DEVICE CHECK
Battery Remaining Longevity: 146 mo
Brady Statistic RA Percent Paced: 85.5 %
Brady Statistic RV Percent Paced: 0.2 %
Implantable Lead Implant Date: 20180509
Implantable Lead Location: 753860
Implantable Lead Model: 3830
Lead Channel Impedance Value: 304 Ohm
Lead Channel Impedance Value: 323 Ohm
Lead Channel Impedance Value: 494 Ohm
Lead Channel Pacing Threshold Amplitude: 0.75 V
Lead Channel Pacing Threshold Pulse Width: 0.4 ms
Lead Channel Sensing Intrinsic Amplitude: 2.5 mV
Lead Channel Setting Sensing Sensitivity: 2 mV
MDC IDC LEAD IMPLANT DT: 20180509
MDC IDC LEAD LOCATION: 753859
MDC IDC MSMT BATTERY VOLTAGE: 3.03 V
MDC IDC MSMT LEADCHNL RV IMPEDANCE VALUE: 551 Ohm
MDC IDC MSMT LEADCHNL RV PACING THRESHOLD AMPLITUDE: 1.5 V
MDC IDC MSMT LEADCHNL RV PACING THRESHOLD PULSEWIDTH: 1 ms
MDC IDC MSMT LEADCHNL RV SENSING INTR AMPL: 6.625 mV
MDC IDC PG IMPLANT DT: 20180509
MDC IDC SESS DTM: 20191127164621
MDC IDC SET LEADCHNL RA PACING AMPLITUDE: 1.75 V
MDC IDC SET LEADCHNL RV PACING AMPLITUDE: 3 V
MDC IDC SET LEADCHNL RV PACING PULSEWIDTH: 1 ms
MDC IDC STAT BRADY AP VP PERCENT: 0.03 %
MDC IDC STAT BRADY AP VS PERCENT: 86.44 %
MDC IDC STAT BRADY AS VP PERCENT: 0 %
MDC IDC STAT BRADY AS VS PERCENT: 13.53 %

## 2018-08-04 ENCOUNTER — Ambulatory Visit (INDEPENDENT_AMBULATORY_CARE_PROVIDER_SITE_OTHER): Payer: Medicare Other | Admitting: Internal Medicine

## 2018-08-04 ENCOUNTER — Encounter: Payer: Self-pay | Admitting: Internal Medicine

## 2018-08-04 VITALS — BP 128/72 | HR 88 | Ht 62.0 in | Wt 138.0 lb

## 2018-08-04 DIAGNOSIS — I480 Paroxysmal atrial fibrillation: Secondary | ICD-10-CM

## 2018-08-04 DIAGNOSIS — Z950 Presence of cardiac pacemaker: Secondary | ICD-10-CM | POA: Diagnosis not present

## 2018-08-04 MED ORDER — FLECAINIDE ACETATE 150 MG PO TABS: 75.0000 mg | ORAL_TABLET | Freq: Two times a day (BID) | ORAL | 3 refills | Status: AC

## 2018-08-04 NOTE — Patient Instructions (Addendum)
Medication Instructions:  Your physician has recommended you make the following change in your medication: HOLD Flecainide and call Thursday or Friday with an update.   Boneta LucksJenny [phone]   Labwork: None ordered.  Testing/Procedures: None ordered.  Follow-Up: Your physician wants you to follow-up in: 9 months with Dr. Ladona Ridgelaylor. You will receive a reminder letter in the mail two months in advance. If you don't receive a letter, please call our office to schedule the follow-up appointment.  Remote monitoring is used to monitor your Pacemaker from home. This monitoring reduces the number of office visits required to check your device to one time per year. It allows us to keep an eye on the functioning of your device to ensure it is working properly. You are scheduled for a device check from home on 10/01/2018. You may send your transmission at any time that day. If you have a wireless device, the transmission will be sent automatically. After your physician reviews your transmission, you will receive a postcard with your next transmission date.  Any Other Special Instructions Will Be Listed Below (If Applicable).  If you need a refill on your cardiac medications before your next appointment, please call your pharmacy.

## 2018-08-04 NOTE — Progress Notes (Signed)
HPI Ms. Kristina Gonzales returns today for followup. Kristina Gonzales is a pleasant 77 yo woman with PAF, sinus node dysfunction, dyslipidemia, s/p PPM insertion. Kristina Gonzales was placed on flecainide with a nice improvement in her atrial fib. When I saw her last Kristina Gonzales c/o a poor appetite. Kristina Gonzales was switched to propafenone and took only a single dose and was convinced that this drug made things even worse. Kristina Gonzales restarted the flecainide. No syncope or chest pain or sob. Minimal palpitations.  Allergies  Allergen Reactions  . Other     Seasonal allergies - runny nose      Current Outpatient Medications  Medication Sig Dispense Refill  . ezetimibe (ZETIA) 10 MG tablet Take 10 mg by mouth daily.    . furosemide (LASIX) 40 MG tablet Take 1 tablet (40 mg total) by mouth daily. 30 tablet 11  . losartan (COZAAR) 100 MG tablet Take 1 tablet (100 mg total) by mouth daily with supper. 30 tablet 11  . metoprolol succinate (TOPROL-XL) 50 MG 24 hr tablet Take 1 tablet (50 mg total) by mouth 2 (two) times daily. Take with or immediately following a meal. 180 tablet 3  . propafenone (RYTHMOL) 225 MG tablet Take 1 tablet (225 mg total) by mouth 2 (two) times daily. 60 tablet 11  . XARELTO 20 MG TABS tablet Take 20 mg by mouth daily with supper.   10   No current facility-administered medications for this visit.      Past Medical History:  Diagnosis Date  . Acute pain of right foot   . Anticipatory grieving    06/22/15  . Arthritis    "a little in my right knee" (12/12/2016)  . Atrial flutter (HCC)   . Chronic bronchitis (HCC)    "less now since I've retired" (12/12/2016)  . Dizziness   . History of blood transfusion    "related to OR" (12/12/2016)  . Hyperlipidemia   . Hypertension   . Irregular heartbeat   . Palpitations   . Presence of permanent cardiac pacemaker   . Stress at home   . Tachy-brady syndrome (HCC)   . Vitamin D deficiency     ROS:   All systems reviewed and negative except as noted in the  HPI.   Past Surgical History:  Procedure Laterality Date  . ABDOMINAL HYSTERECTOMY     "partial"  . CHOLECYSTECTOMY OPEN    . INSERT / REPLACE / REMOVE PACEMAKER  12/12/2016  . PACEMAKER IMPLANT N/A 12/12/2016   Procedure: Pacemaker Implant;  Surgeon: Marinus Mawaylor, Gregg W, MD;  [facility];  Service: Cardiovascular;  Laterality: N/A;  . TONSILLECTOMY AND ADENOIDECTOMY       Family History  Problem Relation Age of Onset  . Hypertension Mother        UNDER HOSPICE CARE  . Diverticulosis Mother   . Other Father [id number]       MYOCARDIAL INFARCTION  . Other Other        PT HAS SIBLINGS X 7     Social History   Socioeconomic History  . Marital status: Widowed    Spouse name: Not on file  . Number of children: 2  . Years of education: Not on file  . Highest education level: Not on file  Occupational History  . Occupation: RETIRED  Social Needs  . Financial resource strain: Not on file  . Food insecurity:    Worry: Not on file    Inability: Not on file  .  Transportation needs:    Medical: Not on file    Non-medical: Not on file  Tobacco Use  . Smoking status: Never Smoker  . Smokeless tobacco: Never Used  Substance and Sexual Activity  . Alcohol use: No  . Drug use: No  . Sexual activity: Never  Lifestyle  . Physical activity:    Days per week: Not on file    Minutes per session: Not on file  . Stress: Not on file  Relationships  . Social connections:    Talks on phone: Not on file    Gets together: Not on file    Attends religious service: Not on file    Active member of club or organization: Not on file    Attends meetings of clubs or organizations: Not on file    Relationship status: Not on file  . Intimate partner violence:    Fear of current or ex partner: Not on file    Emotionally abused: Not on file    Physically abused: Not on file    Forced sexual activity: Not on file  Other Topics Concern  . Not on file  Social History Narrative  . Not  on file     BP 128/72   Pulse 88   Ht 5\' 2"  (1.575 m)   Wt 138 lb (62.6 kg)   BMI 25.24 kg/m   Physical Exam:  Well appearing 77 yo woman, NAD HEENT: Unremarkable Neck:  No JVD, no thyromegally Lymphatics:  No adenopathy Back:  No CVA tenderness Lungs:  Clear with no wheezes HEART:  Regular rate rhythm, no murmurs, no rubs, no clicks Abd:  soft, positive bowel sounds, no organomegally, no rebound, no guarding Ext:  2 plus pulses, no edema, no cyanosis, no clubbing Skin:  No rashes no nodules Neuro:  CN II through XII intact, motor grossly intact  EKG - nsr with atrial pacing  DEVICE  Normal device function.  See PaceArt for details.   Assess/Plan: 1. PAF - Kristina Gonzales is in NSR about 15% of the time.  2. Anorexia - unclear if her taste buds are due to the flecainide. I have asked her to stop the flecainide and call us and let us know if her anorexia improves. If it does then we would consider a different AA drug to help control her atrial fib. 3. HTN - her blood pressure is controlled today.  4. coags - Kristina Gonzales has not had any bleeding although Kristina Gonzales c/o cold hands. Kristina Gonzales thinks that this is due to the Xarelto. I have tried to reassure her otherwise.  Leonia ReevesGregg Taylor,M.D.

## 2018-08-05 LAB — CUP PACEART INCLINIC DEVICE CHECK
Implantable Lead Implant Date: 20180509
Implantable Lead Implant Date: 20180509
Implantable Lead Location: 753860
Implantable Pulse Generator Implant Date: 20180509
MDC IDC LEAD LOCATION: 753859
MDC IDC SESS DTM: 20191231151858

## 2018-08-07 ENCOUNTER — Telehealth: Payer: Self-pay

## 2018-08-07 NOTE — Telephone Encounter (Signed)
Call received from Pt.  Pt will continue flecainide.  Pt did not discern a difference with her taste buds off the flecainide.  Advised Pt f/u with her PCP for further issues with anorexia.  Pt indicates understanding.

## 2018-08-22 LAB — CUP PACEART REMOTE DEVICE CHECK
Battery Remaining Longevity: 145 mo
Battery Voltage: 3.03 V
Brady Statistic AP VP Percent: 0.05 %
Brady Statistic AS VS Percent: 11.63 %
Date Time Interrogation Session: 20191127052854
Implantable Lead Implant Date: 20180509
Implantable Lead Model: 3830
Implantable Lead Model: 5076
Implantable Pulse Generator Implant Date: 20180509
Lead Channel Impedance Value: 304 Ohm
Lead Channel Impedance Value: 323 Ohm
Lead Channel Pacing Threshold Amplitude: 0.625 V
Lead Channel Pacing Threshold Amplitude: 2.375 V
Lead Channel Pacing Threshold Pulse Width: 0.4 ms
Lead Channel Sensing Intrinsic Amplitude: 1.125 mV
Lead Channel Sensing Intrinsic Amplitude: 5.25 mV
Lead Channel Setting Pacing Amplitude: 1.75 V
Lead Channel Setting Pacing Pulse Width: 1 ms
MDC IDC LEAD IMPLANT DT: 20180509
MDC IDC LEAD LOCATION: 753859
MDC IDC LEAD LOCATION: 753860
MDC IDC MSMT LEADCHNL RA IMPEDANCE VALUE: 437 Ohm
MDC IDC MSMT LEADCHNL RA PACING THRESHOLD PULSEWIDTH: 0.4 ms
MDC IDC MSMT LEADCHNL RA SENSING INTR AMPL: 1.125 mV
MDC IDC MSMT LEADCHNL RV IMPEDANCE VALUE: 627 Ohm
MDC IDC MSMT LEADCHNL RV SENSING INTR AMPL: 5.25 mV
MDC IDC SET LEADCHNL RV PACING AMPLITUDE: 3 V
MDC IDC SET LEADCHNL RV SENSING SENSITIVITY: 2 mV
MDC IDC STAT BRADY AP VS PERCENT: 88.33 %
MDC IDC STAT BRADY AS VP PERCENT: 0 %
MDC IDC STAT BRADY RA PERCENT PACED: 85.51 %
MDC IDC STAT BRADY RV PERCENT PACED: 0.47 %

## 2018-10-01 ENCOUNTER — Ambulatory Visit (INDEPENDENT_AMBULATORY_CARE_PROVIDER_SITE_OTHER): Payer: Medicare Other | Admitting: *Deleted

## 2018-10-01 DIAGNOSIS — R001 Bradycardia, unspecified: Secondary | ICD-10-CM

## 2018-10-01 DIAGNOSIS — I495 Sick sinus syndrome: Secondary | ICD-10-CM | POA: Diagnosis not present

## 2018-10-02 LAB — CUP PACEART REMOTE DEVICE CHECK
Battery Voltage: 3.03 V
Brady Statistic AP VP Percent: 0.03 %
Brady Statistic AS VP Percent: 0 %
Brady Statistic AS VS Percent: 11.46 %
Brady Statistic RV Percent Paced: 0.54 %
Date Time Interrogation Session: 20200226054201
Implantable Lead Implant Date: 20180509
Implantable Lead Model: 3830
Lead Channel Impedance Value: 304 Ohm
Lead Channel Impedance Value: 323 Ohm
Lead Channel Impedance Value: 475 Ohm
Lead Channel Impedance Value: 532 Ohm
Lead Channel Pacing Threshold Amplitude: 0.75 V
Lead Channel Pacing Threshold Amplitude: 2.375 V
Lead Channel Pacing Threshold Pulse Width: 0.4 ms
Lead Channel Sensing Intrinsic Amplitude: 6.125 mV
Lead Channel Sensing Intrinsic Amplitude: 6.125 mV
Lead Channel Setting Pacing Amplitude: 1.75 V
Lead Channel Setting Pacing Amplitude: 3 V
Lead Channel Setting Pacing Pulse Width: 1 ms
Lead Channel Setting Sensing Sensitivity: 2 mV
MDC IDC LEAD IMPLANT DT: 20180509
MDC IDC LEAD LOCATION: 753859
MDC IDC LEAD LOCATION: 753860
MDC IDC MSMT BATTERY REMAINING LONGEVITY: 143 mo
MDC IDC MSMT LEADCHNL RA SENSING INTR AMPL: 2.5 mV
MDC IDC MSMT LEADCHNL RA SENSING INTR AMPL: 2.5 mV
MDC IDC MSMT LEADCHNL RV PACING THRESHOLD PULSEWIDTH: 0.4 ms
MDC IDC PG IMPLANT DT: 20180509
MDC IDC STAT BRADY AP VS PERCENT: 88.53 %
MDC IDC STAT BRADY RA PERCENT PACED: 82.56 %

## 2018-10-08 NOTE — Progress Notes (Signed)
Remote pacemaker transmission.   

## 2018-11-26 IMAGING — CR DG CHEST 2V
2 series · 2 of 2 positions shown · non-contrast
Comparison: None in PACs

CLINICAL DATA: Status post permanent pacemaker placement. Chest
soreness. History of atrial flutter and TachyBrady syndrome.

EXAM:
CHEST  2 VIEW

[chest pa]
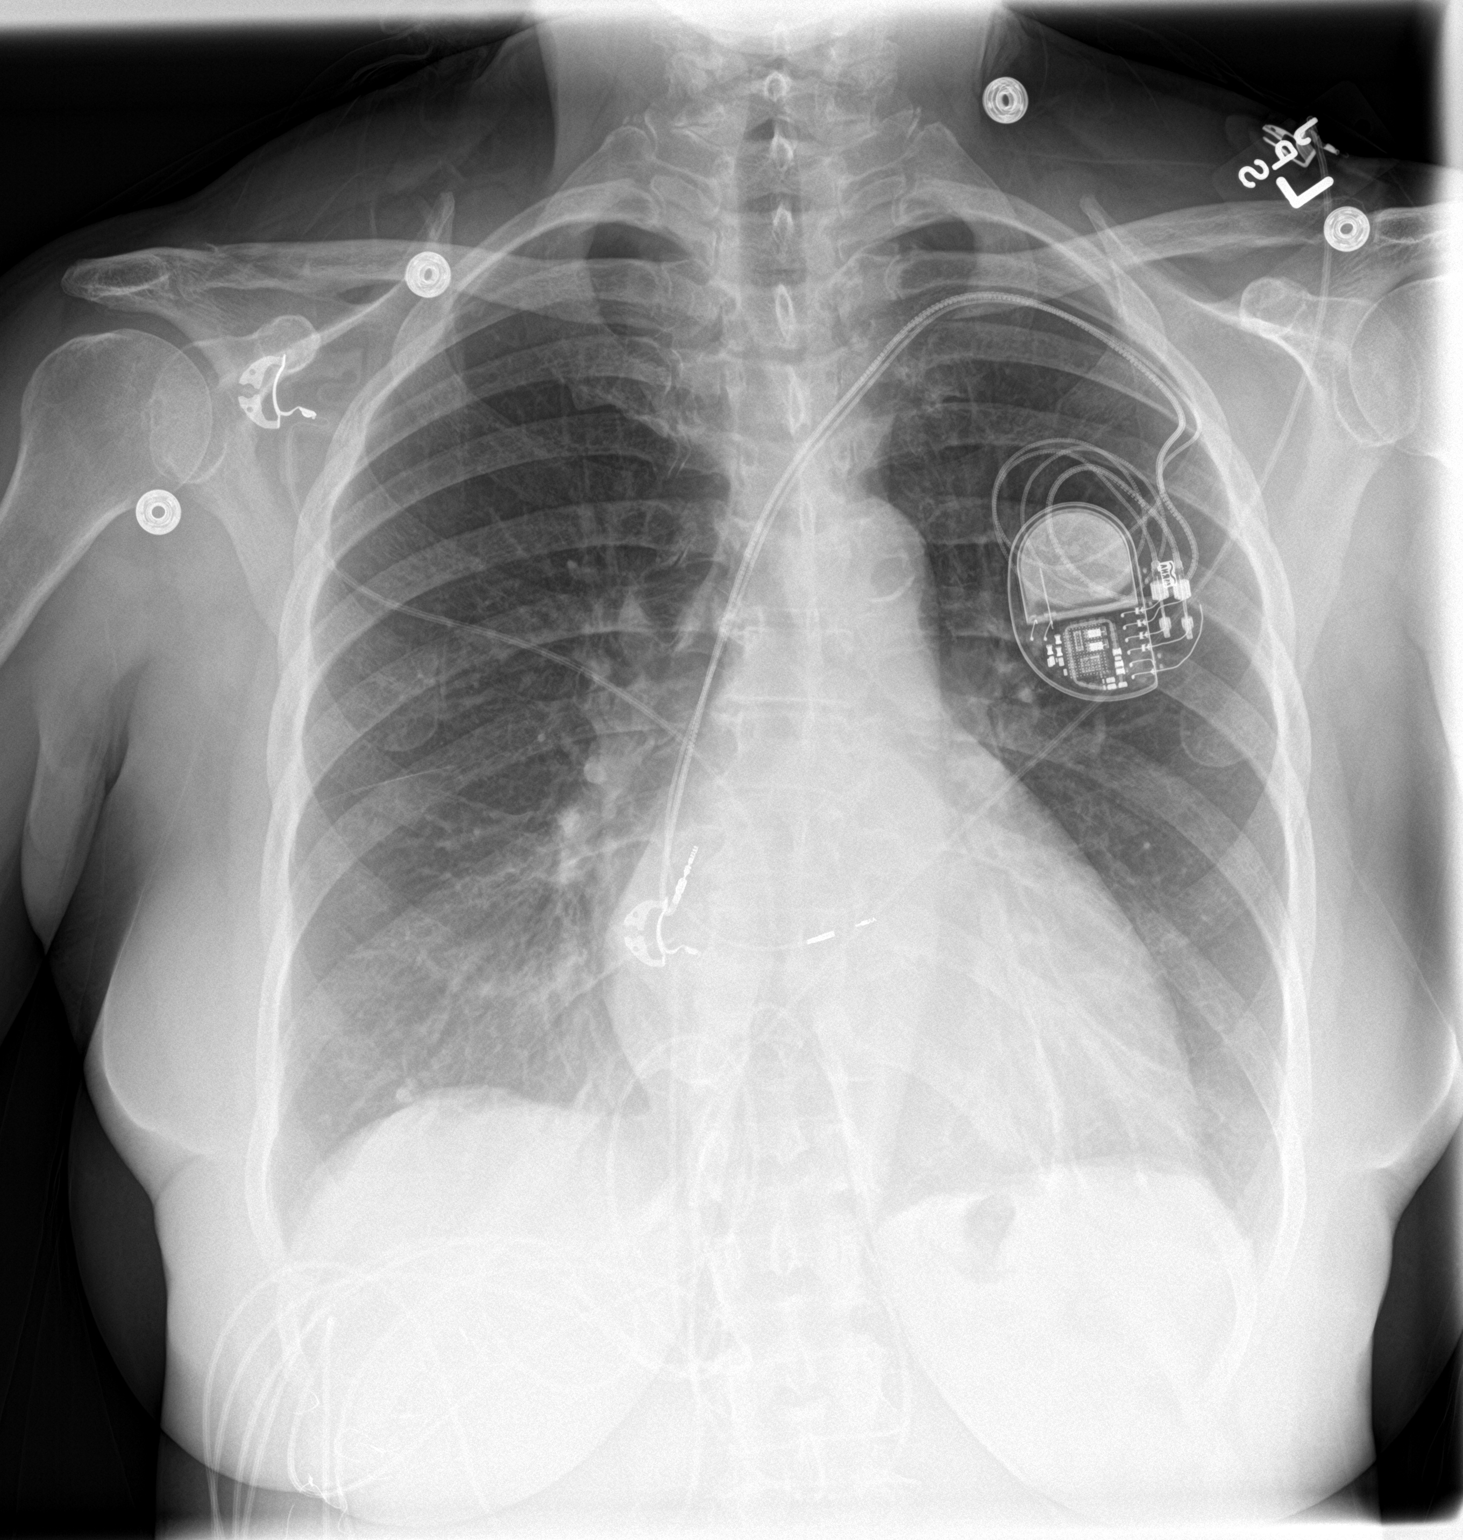

[chest lat]
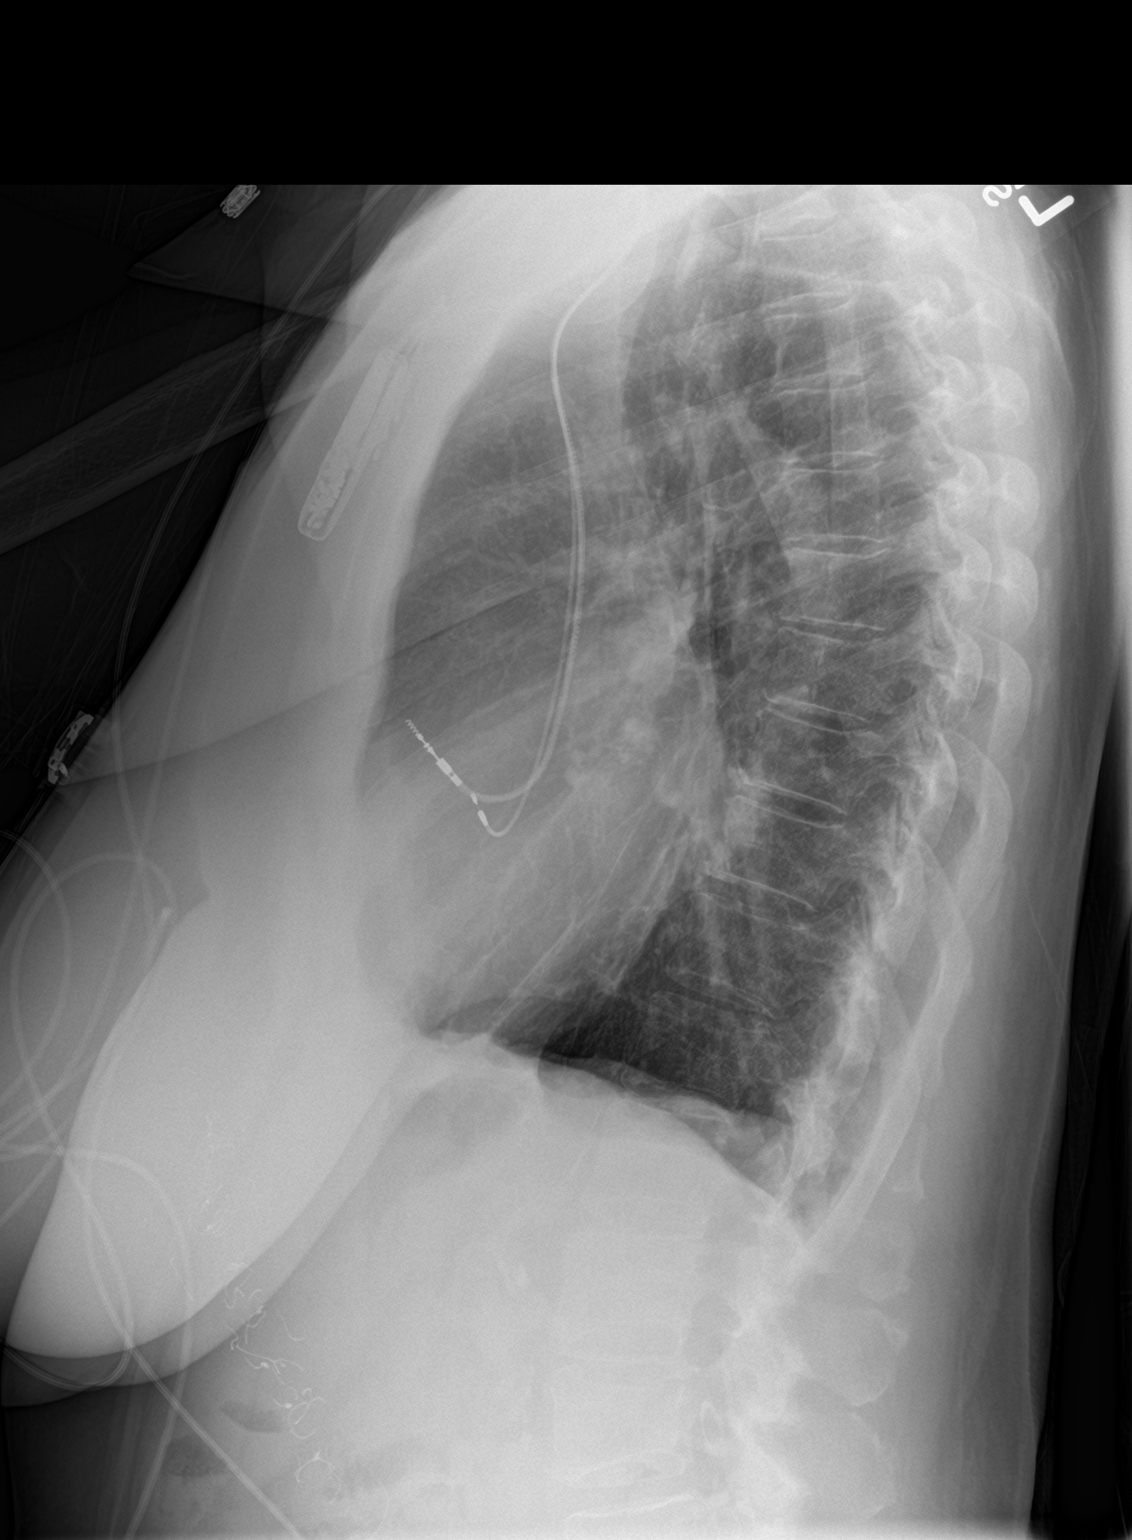

[2 of 2 positions shown; findings below may reference images not displayed]

FINDINGS: The lungs are well-expanded and clear. There is no pneumothorax or
pneumomediastinum or pleural effusion. The pacemaker electrodes are
in reasonable position radiographically though the ventricular lead
is somewhat proximally positioned rather than in the right
ventricular apex. This may be intentional. The heart and pulmonary
vascularity are normal. There is calcification in the wall of the
aortic arch. The bony thorax exhibits no acute abnormality.
IMPRESSION: No postprocedure complication following permanent pacemaker
placement. Correlation clinically as to the adequacy of the right
ventricular lead is needed.

Thoracic aortic atherosclerosis.

## 2019-01-01 ENCOUNTER — Ambulatory Visit (INDEPENDENT_AMBULATORY_CARE_PROVIDER_SITE_OTHER): Payer: Medicare Other | Admitting: *Deleted

## 2019-01-01 DIAGNOSIS — I495 Sick sinus syndrome: Secondary | ICD-10-CM

## 2019-01-01 LAB — CUP PACEART REMOTE DEVICE CHECK
Battery Remaining Longevity: 134 mo
Battery Voltage: 3.02 V
Brady Statistic AP VP Percent: 0.01 %
Brady Statistic AP VS Percent: 97.66 %
Brady Statistic AS VP Percent: 0 %
Brady Statistic AS VS Percent: 2.34 %
Brady Statistic RA Percent Paced: 94.86 %
Brady Statistic RV Percent Paced: 0.38 %
Date Time Interrogation Session: 20200528061801
Implantable Lead Implant Date: 20180509
Implantable Lead Implant Date: 20180509
Implantable Lead Location: 753859
Implantable Lead Location: 753860
Implantable Lead Model: 3830
Implantable Lead Model: 5076
Implantable Pulse Generator Implant Date: 20180509
Lead Channel Impedance Value: 304 Ohm
Lead Channel Impedance Value: 323 Ohm
Lead Channel Impedance Value: 437 Ohm
Lead Channel Impedance Value: 589 Ohm
Lead Channel Pacing Threshold Amplitude: 0.875 V
Lead Channel Pacing Threshold Pulse Width: 0.4 ms
Lead Channel Sensing Intrinsic Amplitude: 1.125 mV
Lead Channel Sensing Intrinsic Amplitude: 6.125 mV
Lead Channel Setting Pacing Amplitude: 2 V
Lead Channel Setting Pacing Amplitude: 3 V
Lead Channel Setting Pacing Pulse Width: 1 ms
Lead Channel Setting Sensing Sensitivity: 2 mV

## 2019-01-07 ENCOUNTER — Encounter: Payer: Self-pay | Admitting: Cardiology

## 2019-01-07 NOTE — Progress Notes (Signed)
Remote pacemaker transmission.   

## 2019-04-02 ENCOUNTER — Ambulatory Visit (INDEPENDENT_AMBULATORY_CARE_PROVIDER_SITE_OTHER): Payer: Medicare Other | Admitting: *Deleted

## 2019-04-02 DIAGNOSIS — I480 Paroxysmal atrial fibrillation: Secondary | ICD-10-CM

## 2019-04-02 DIAGNOSIS — I495 Sick sinus syndrome: Secondary | ICD-10-CM | POA: Diagnosis not present

## 2019-04-02 LAB — CUP PACEART REMOTE DEVICE CHECK
Battery Remaining Longevity: 135 mo
Battery Voltage: 3.02 V
Brady Statistic AP VP Percent: 0.09 %
Brady Statistic AP VS Percent: 96.56 %
Brady Statistic AS VP Percent: 0.02 %
Brady Statistic AS VS Percent: 3.34 %
Brady Statistic RA Percent Paced: 92.42 %
Brady Statistic RV Percent Paced: 0.75 %
Date Time Interrogation Session: 20200827064553
Implantable Lead Implant Date: 20180509
Implantable Lead Implant Date: 20180509
Implantable Lead Location: 753859
Implantable Lead Location: 753860
Implantable Lead Model: 3830
Implantable Lead Model: 5076
Implantable Pulse Generator Implant Date: 20180509
Lead Channel Impedance Value: 304 Ohm
Lead Channel Impedance Value: 342 Ohm
Lead Channel Impedance Value: 437 Ohm
Lead Channel Impedance Value: 589 Ohm
Lead Channel Pacing Threshold Amplitude: 0.875 V
Lead Channel Pacing Threshold Pulse Width: 0.4 ms
Lead Channel Sensing Intrinsic Amplitude: 2.5 mV
Lead Channel Sensing Intrinsic Amplitude: 6.875 mV
Lead Channel Setting Pacing Amplitude: 1.75 V
Lead Channel Setting Pacing Amplitude: 3 V
Lead Channel Setting Pacing Pulse Width: 1 ms
Lead Channel Setting Sensing Sensitivity: 2 mV

## 2019-04-08 NOTE — Progress Notes (Signed)
Remote pacemaker transmission.   

## 2019-07-03 ENCOUNTER — Other Ambulatory Visit: Payer: Self-pay | Admitting: Internal Medicine

## 2019-07-06 ENCOUNTER — Ambulatory Visit (INDEPENDENT_AMBULATORY_CARE_PROVIDER_SITE_OTHER): Payer: Medicare Other | Admitting: *Deleted

## 2019-07-06 DIAGNOSIS — I495 Sick sinus syndrome: Secondary | ICD-10-CM | POA: Diagnosis not present

## 2019-07-06 LAB — CUP PACEART REMOTE DEVICE CHECK
Battery Remaining Longevity: 131 mo
Battery Voltage: 3.02 V
Brady Statistic AP VP Percent: 0.09 %
Brady Statistic AP VS Percent: 92.67 %
Brady Statistic AS VP Percent: 0.01 %
Brady Statistic AS VS Percent: 7.25 %
Brady Statistic RA Percent Paced: 86.52 %
Brady Statistic RV Percent Paced: 1.2 %
Date Time Interrogation Session: 20201130011431
Implantable Lead Implant Date: 20180509
Implantable Lead Implant Date: 20180509
Implantable Lead Location: 753859
Implantable Lead Location: 753860
Implantable Lead Model: 3830
Implantable Lead Model: 5076
Implantable Pulse Generator Implant Date: 20180509
Lead Channel Impedance Value: 304 Ohm
Lead Channel Impedance Value: 342 Ohm
Lead Channel Impedance Value: 437 Ohm
Lead Channel Impedance Value: 551 Ohm
Lead Channel Pacing Threshold Amplitude: 0.875 V
Lead Channel Pacing Threshold Amplitude: 2.375 V
Lead Channel Pacing Threshold Pulse Width: 0.4 ms
Lead Channel Pacing Threshold Pulse Width: 0.4 ms
Lead Channel Sensing Intrinsic Amplitude: 2.25 mV
Lead Channel Sensing Intrinsic Amplitude: 2.25 mV
Lead Channel Sensing Intrinsic Amplitude: 5 mV
Lead Channel Sensing Intrinsic Amplitude: 5 mV
Lead Channel Setting Pacing Amplitude: 1.75 V
Lead Channel Setting Pacing Amplitude: 3 V
Lead Channel Setting Pacing Pulse Width: 1 ms
Lead Channel Setting Sensing Sensitivity: 2 mV

## 2019-07-29 NOTE — Progress Notes (Signed)
PPM remote 

## 2019-08-10 ENCOUNTER — Other Ambulatory Visit: Payer: Self-pay | Admitting: Internal Medicine

## 2019-09-08 ENCOUNTER — Telehealth: Payer: Self-pay | Admitting: Internal Medicine

## 2019-09-08 MED ORDER — METOPROLOL SUCCINATE ER 50 MG PO TB24: 50.0000 mg | ORAL_TABLET | Freq: Every day | ORAL | 1 refills | Status: DC

## 2019-09-08 NOTE — Telephone Encounter (Signed)
Pt's medication was sent to pt's pharmacy as requested. Confirmation received.  °

## 2019-09-08 NOTE — Telephone Encounter (Signed)
New message     *STAT* If patient is at the pharmacy, call can be transferred to refill team.   1. Which medications need to be refilled? (please list name of each medication and dose if known) metoprolol 50 mg   2. Which pharmacy/location (including street and city if local pharmacy) is medication to be sent to? [facility] [phone]  3. Do they need a 30 day or 90 day supply? 30 day

## 2019-10-05 ENCOUNTER — Ambulatory Visit (INDEPENDENT_AMBULATORY_CARE_PROVIDER_SITE_OTHER): Payer: Medicare PPO | Admitting: *Deleted

## 2019-10-05 DIAGNOSIS — I495 Sick sinus syndrome: Secondary | ICD-10-CM

## 2019-10-05 LAB — CUP PACEART REMOTE DEVICE CHECK
Battery Remaining Longevity: 128 mo
Battery Voltage: 3.02 V
Brady Statistic AP VP Percent: 0.04 %
Brady Statistic AP VS Percent: 90.35 %
Brady Statistic AS VP Percent: 0.02 %
Brady Statistic AS VS Percent: 9.61 %
Brady Statistic RA Percent Paced: 82.63 %
Brady Statistic RV Percent Paced: 2.05 %
Date Time Interrogation Session: 20210301011545
Implantable Lead Implant Date: 20180509
Implantable Lead Implant Date: 20180509
Implantable Lead Location: 753859
Implantable Lead Location: 753860
Implantable Lead Model: 3830
Implantable Lead Model: 5076
Implantable Pulse Generator Implant Date: 20180509
Lead Channel Impedance Value: 285 Ohm
Lead Channel Impedance Value: 323 Ohm
Lead Channel Impedance Value: 418 Ohm
Lead Channel Impedance Value: 608 Ohm
Lead Channel Pacing Threshold Amplitude: 0.875 V
Lead Channel Pacing Threshold Amplitude: 2.375 V
Lead Channel Pacing Threshold Pulse Width: 0.4 ms
Lead Channel Pacing Threshold Pulse Width: 0.4 ms
Lead Channel Sensing Intrinsic Amplitude: 1.125 mV
Lead Channel Sensing Intrinsic Amplitude: 1.125 mV
Lead Channel Sensing Intrinsic Amplitude: 5.375 mV
Lead Channel Sensing Intrinsic Amplitude: 5.375 mV
Lead Channel Setting Pacing Amplitude: 1.75 V
Lead Channel Setting Pacing Amplitude: 3 V
Lead Channel Setting Pacing Pulse Width: 1 ms
Lead Channel Setting Sensing Sensitivity: 2 mV

## 2019-10-05 NOTE — Progress Notes (Signed)
PPM Remote  

## 2019-10-20 ENCOUNTER — Other Ambulatory Visit: Payer: Self-pay

## 2019-10-20 ENCOUNTER — Telehealth (INDEPENDENT_AMBULATORY_CARE_PROVIDER_SITE_OTHER): Payer: Medicare PPO | Admitting: Internal Medicine

## 2019-10-20 ENCOUNTER — Encounter: Payer: Self-pay | Admitting: Internal Medicine

## 2019-10-20 VITALS — BP 145/63 | HR 61 | Ht 62.0 in | Wt 137.0 lb

## 2019-10-20 DIAGNOSIS — Z7189 Other specified counseling: Secondary | ICD-10-CM

## 2019-10-20 DIAGNOSIS — I4891 Unspecified atrial fibrillation: Secondary | ICD-10-CM | POA: Diagnosis not present

## 2019-10-20 DIAGNOSIS — I495 Sick sinus syndrome: Secondary | ICD-10-CM | POA: Diagnosis not present

## 2019-10-20 DIAGNOSIS — Z7901 Long term (current) use of anticoagulants: Secondary | ICD-10-CM

## 2019-10-20 DIAGNOSIS — Z950 Presence of cardiac pacemaker: Secondary | ICD-10-CM

## 2019-10-20 DIAGNOSIS — I10 Essential (primary) hypertension: Secondary | ICD-10-CM | POA: Diagnosis not present

## 2019-10-20 NOTE — Patient Instructions (Signed)
Medication Instructions:  Your physician recommends that you continue on your current medications as directed. Please refer to the Current Medication list given to you today.  Labwork: None ordered.  Testing/Procedures: None ordered.  Follow-Up: Your physician wants you to follow-up in: one year with Dr. Ladona Ridgel.   You will receive a reminder letter in the mail two months in advance. If you don't receive a letter, please call our office to schedule the follow-up appointment.  Remote monitoring is used to monitor your Pacemaker from home. This monitoring reduces the number of office visits required to check your device to one time per year. It allows [place] to keep an eye on the functioning of your device to ensure it is working properly. You are scheduled for a device check from home on 01/06/2020. You may send your transmission at any time that day. If you have a wireless device, the transmission will be sent automatically. After your physician reviews your transmission, you will receive a postcard with your next transmission date.  Any Other Special Instructions Will Be Listed Below (If Applicable).  If you need a refill on your cardiac medications before your next appointment, please call your pharmacy.

## 2019-10-20 NOTE — Progress Notes (Signed)
Electrophysiology TeleHealth Note   Due to national recommendations of social distancing due to COVID 19, an audio/video telehealth visit is felt to be most appropriate for this patient at this time.  See MyChart message from today for the patient's consent to telehealth for [facility].   Date:  10/20/2019   ID:  Kristina Gonzales, DOB [birth year], MRN [id number]  Location: patient's home  Provider location: [address], [place]  Evaluation Performed: Follow-up visit  PCP:  Yvonne Kendall, MD  Cardiologist:  No primary care provider on file.  Electrophysiologist:  Dr Lovena Le  Chief Complaint:   "I am staying inside the house." History of Present Illness:    Kristina Gonzales is a 79 y.o. female who presents via audio/video conferencing for a telehealth visit today. She is a pleasant 79 yo woman with a h/o HTN, PAF, sinus node dysfunction s/p PPM insertion. She has done well on flecainide and toprol and has had minimal symptomatic arrhythmias. She denies any bleeding on her Xarelto. She lives in [place].  Since last being seen in our clinic, the patient reports doing very well.  Today, she denies symptoms of palpitations, chest pain, shortness of breath,  lower extremity edema, dizziness, presyncope, or syncope.  The patient is otherwise without complaint today.  The patient denies symptoms of fevers, chills, cough, or new SOB worrisome for COVID 19.  Past Medical History:  Diagnosis Date  . Acute pain of right foot   . Anticipatory grieving    06/22/15  . Arthritis    "a little in my right knee" (12/12/2016)  . Atrial flutter ([place])   . Chronic bronchitis ([place])    "less now since I've retired" (12/12/2016)  . Dizziness   . History of blood transfusion    "related to OR" (12/12/2016)  . Hyperlipidemia   . Hypertension   . Irregular heartbeat   . Palpitations   . Presence of permanent cardiac pacemaker   . Stress at home   . Tachy-brady syndrome ([place])   .  Vitamin D deficiency     Past Surgical History:  Procedure Laterality Date  . ABDOMINAL HYSTERECTOMY     "partial"  . CHOLECYSTECTOMY OPEN    . INSERT / REPLACE / REMOVE PACEMAKER  12/12/2016  . PACEMAKER IMPLANT N/A 12/12/2016   Procedure: Pacemaker Implant;  Surgeon: Evans Lance, MD;  Location: [facility];  Service: Cardiovascular;  Laterality: N/A;  . TONSILLECTOMY AND ADENOIDECTOMY      Current Outpatient Medications  Medication Sig Dispense Refill  . ezetimibe (ZETIA) 10 MG tablet Take 10 mg by mouth daily.    . flecainide (TAMBOCOR) 150 MG tablet Take 0.5 tablets (75 mg total) by mouth 2 (two) times daily. 90 tablet 3  . furosemide (LASIX) 40 MG tablet Take 1 tablet (40 mg total) by mouth daily. 30 tablet 11  . losartan (COZAAR) 100 MG tablet Take 1 tablet (100 mg total) by mouth daily with supper. 30 tablet 11  . metoprolol succinate (TOPROL-XL) 50 MG 24 hr tablet Take 1 tablet (50 mg total) by mouth daily. Please keep upcoming appt with Dr. Lovena Le in March before anymore refills. 1st attempt 30 tablet 1  . XARELTO 20 MG TABS tablet Take 20 mg by mouth daily with supper.   10   No current facility-administered medications for this visit.    Allergies:   Other   Social History:  The patient  reports that she  has never smoked. She has never used smokeless tobacco. She reports that she does not drink alcohol or use drugs.   Family History:  The patient's  family history includes Diverticulosis in her mother; Hypertension in her mother; Other in an other family member; Other (age of onset: 79) in her father.   ROS:  Please see the history of present illness.   All other systems are personally reviewed and negative.    Exam:    Vital Signs:  BP (!) 145/63   Pulse 61   Ht 5\' 2"  (1.575 m)   Wt 137 lb (62.1 kg)   BMI 25.06 kg/m   Well appearing, alert and conversant, regular work of breathing,  good skin color Eyes- anicteric, neuro- grossly intact, skin- no  apparent rash or lesions or cyanosis, mouth- oral mucosa is pink   Labs/Other Tests and Data Reviewed:    Recent Labs: No results found for requested labs within last 8760 hours.   Wt Readings from Last 3 Encounters:  10/20/19 137 lb (62.1 kg)  08/04/18 138 lb (62.6 kg)  07/02/18 134 lb (60.8 kg)     Other studies personally reviewed:  Last device remote is reviewed from [employer] PDF dated 10/05/19 which reveals normal device function, about 10% atrial fib.   ASSESSMENT & PLAN:    1.  PAF - she is maintaining NSR about 90% of the time on flecainide 75 bid and toprol. She will continue her current meds. 2. Coags - she has done well on xarelto for thromboembolic prevention. She will continue. No bleeding. 3. HTN - she notes that her bp is usually in the 130-140 range.  4. Sinus node dysfunction - she is asymptomatic and is pacing 83% of the time.  5. COVID 19 screen The patient denies symptoms of COVID 19 at this time.  The importance of social distancing was discussed today. She has been vaccinated twice  Follow-up:  12/21 Next remote: 6/21  Current medicines are reviewed at length with the patient today.   The patient does not have concerns regarding her medicines.  The following changes were made today:  none  Labs/ tests ordered today include: none No orders of the defined types were placed in this encounter.    Patient Risk:  after full review of this patients clinical status, I feel that they are at moderate risk at this time.  Today, I have spent 15 minutes with the patient with telehealth technology discussing her HTN and atrial fib and PPM .    Signed, 7/21, MD  10/20/2019 8:46 PM     [facility] [address] [place] [phone] (office) [phone] (fax)

## 2019-11-04 ENCOUNTER — Telehealth: Payer: Self-pay

## 2019-11-04 NOTE — Telephone Encounter (Signed)
The so want to be transferred to [facility].

## 2019-11-19 ENCOUNTER — Other Ambulatory Visit: Payer: Self-pay | Admitting: Internal Medicine

## 2022-09-06 DEATH — deceased
